# Patient Record
Sex: Female | Born: 1955 | Hispanic: Yes | Marital: Married | Smoking: Never smoker
Health system: Southern US, Community
[De-identification: ages and names within clinical notes are randomized; demographics above are authoritative.]

## PROBLEM LIST (undated history)

## (undated) DIAGNOSIS — I10 Essential (primary) hypertension: Secondary | ICD-10-CM

---

## 2004-03-03 ENCOUNTER — Ambulatory Visit: Payer: Self-pay

## 2005-05-11 ENCOUNTER — Ambulatory Visit: Payer: Self-pay

## 2006-06-28 ENCOUNTER — Ambulatory Visit: Payer: Self-pay

## 2006-10-24 ENCOUNTER — Ambulatory Visit: Payer: Self-pay | Admitting: Family Medicine

## 2007-07-17 ENCOUNTER — Ambulatory Visit: Payer: Self-pay

## 2008-07-08 ENCOUNTER — Ambulatory Visit: Payer: Self-pay

## 2009-04-13 IMAGING — US ABDOMEN ULTRASOUND
1 series · 17 of 25 positions shown · non-contrast
Comparison: none

REASON FOR EXAM: abdominal pain RUQ  PR notified for interpreter
COMMENTS:

[Series 1: abdomen ultrasound · 17 of 67 slices shown]
[im 1/67]
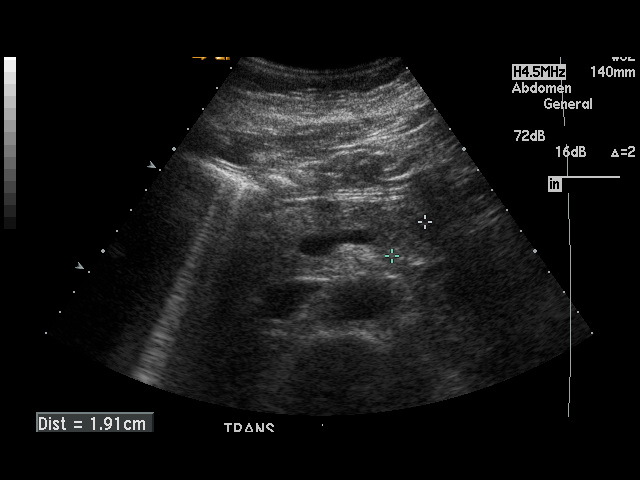
[im 6/67]
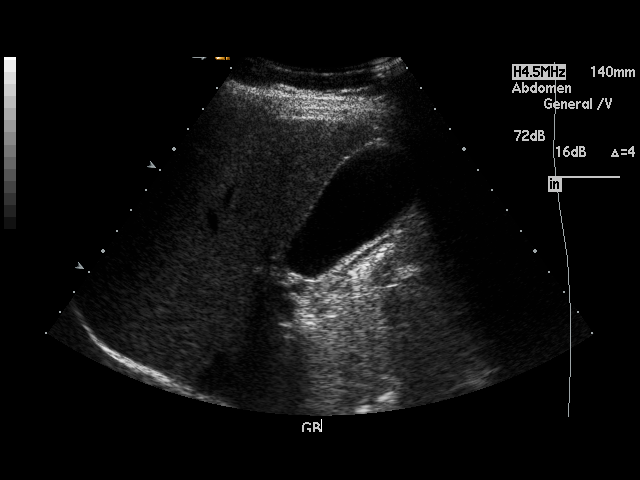
[im 9/67]
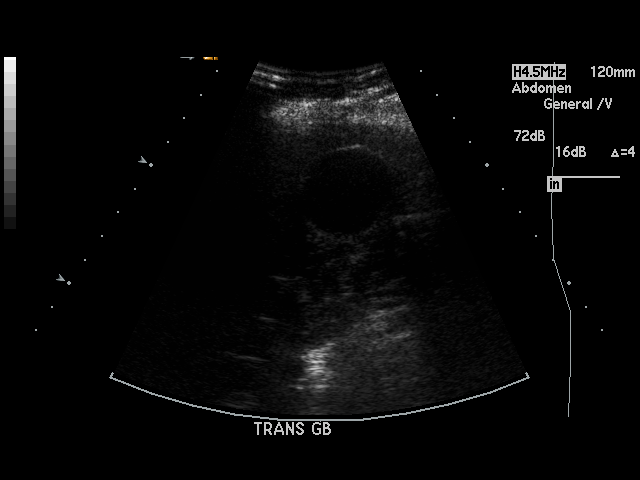
[im 14/67]
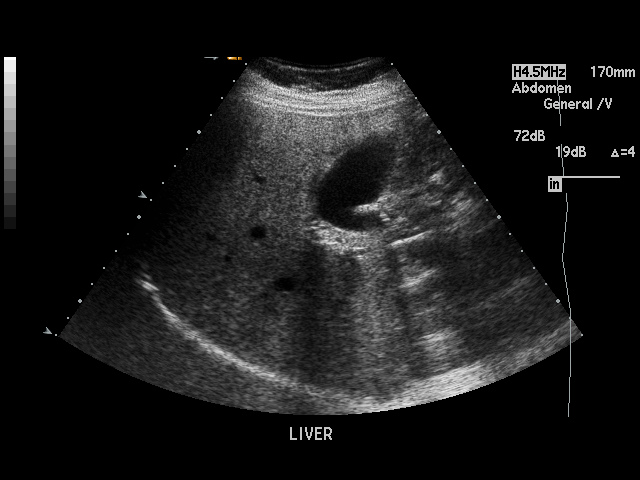
[im 17/67]
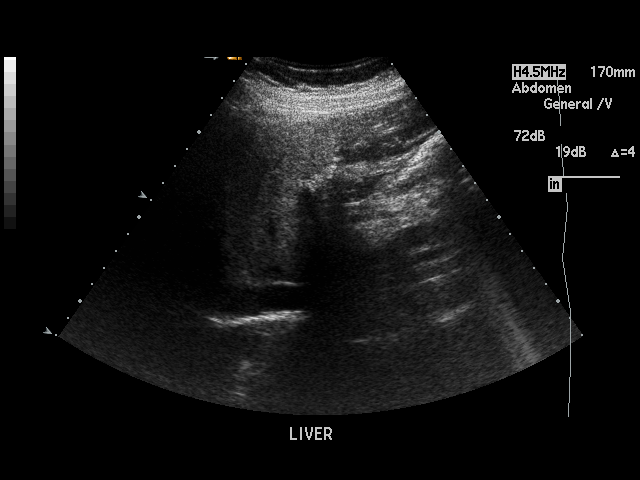
[im 23/67]
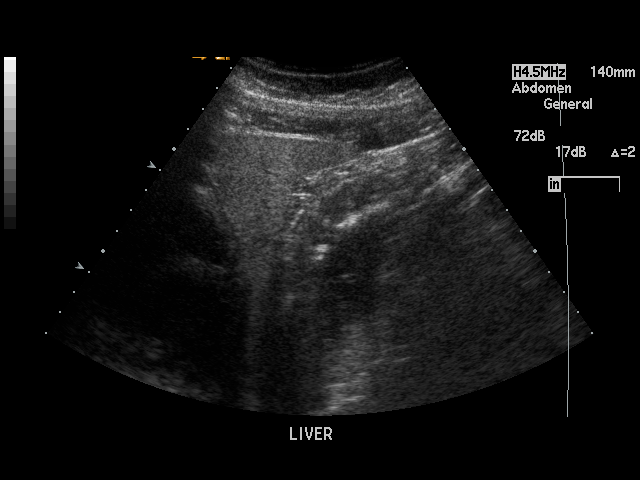
[im 25/67]
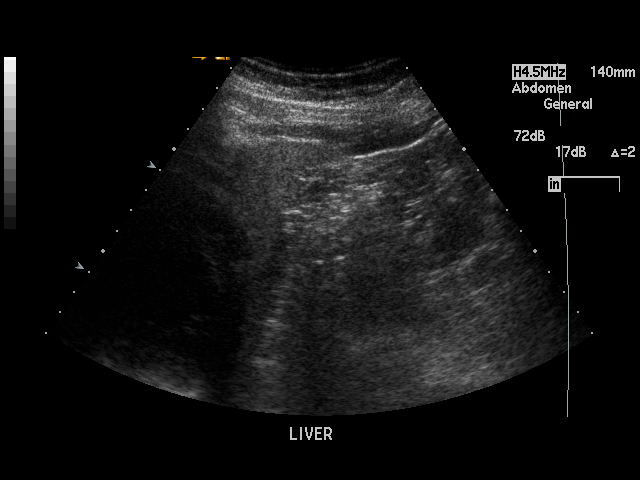
[im 31/67]
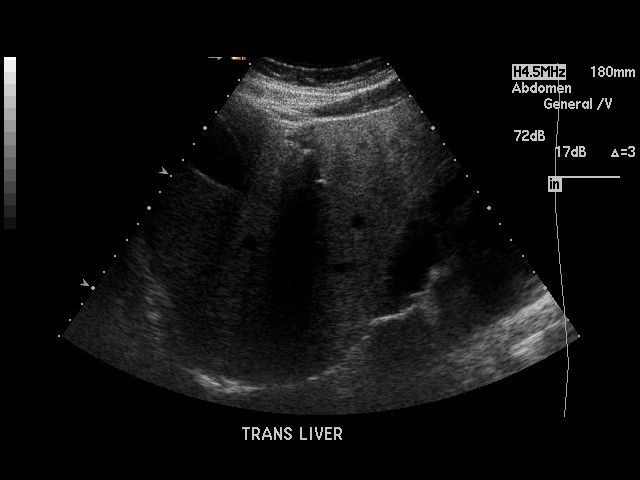
[im 34/67]
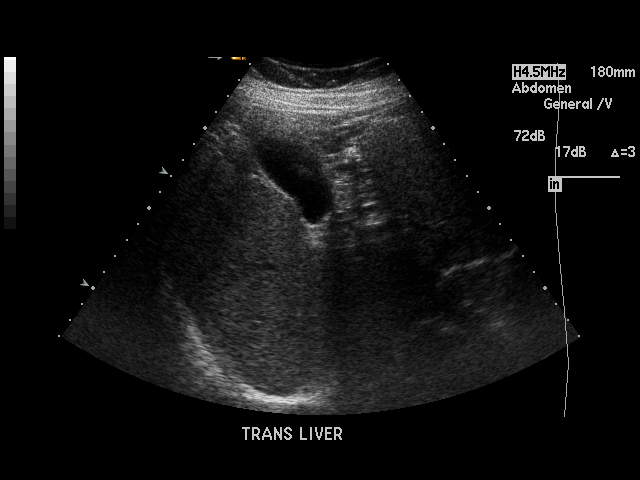
[im 36/67]
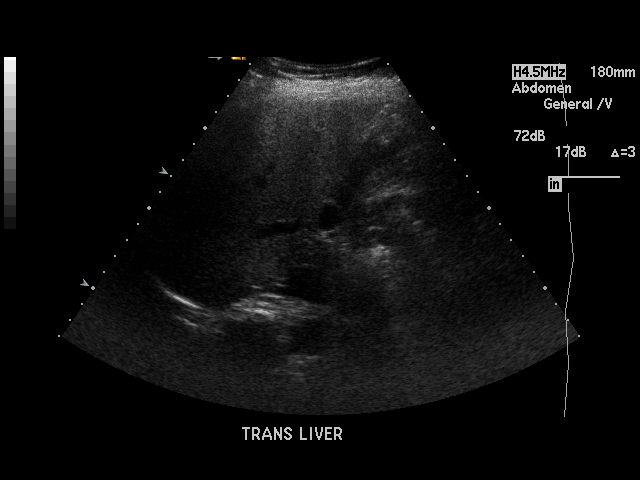
[im 42/67]
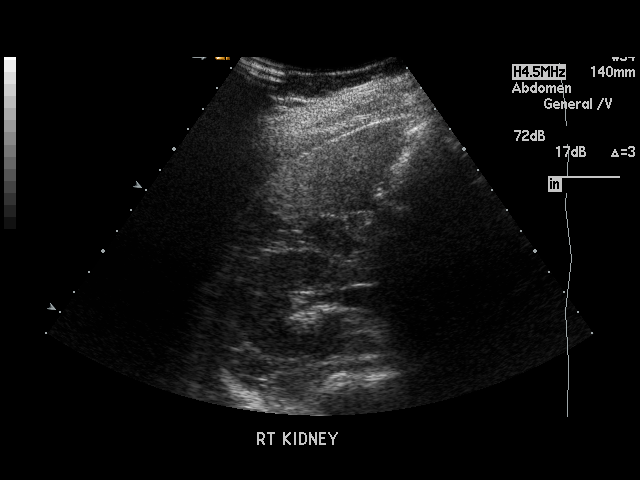
[im 45/67]
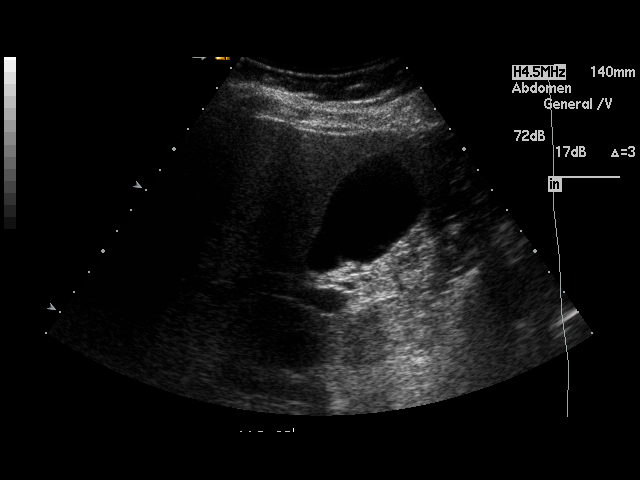
[im 50/67]
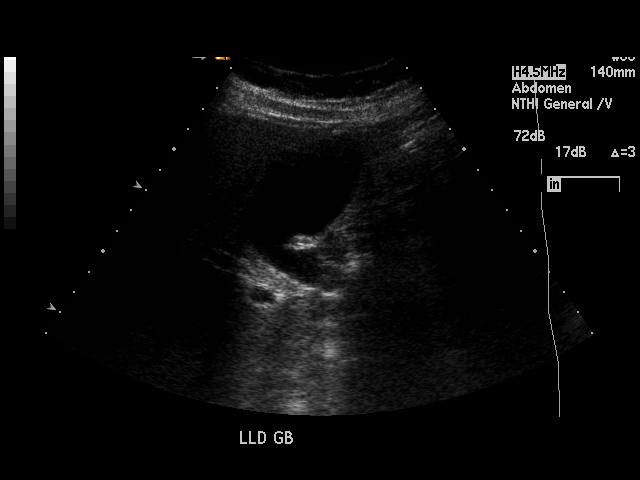
[im 53/67]
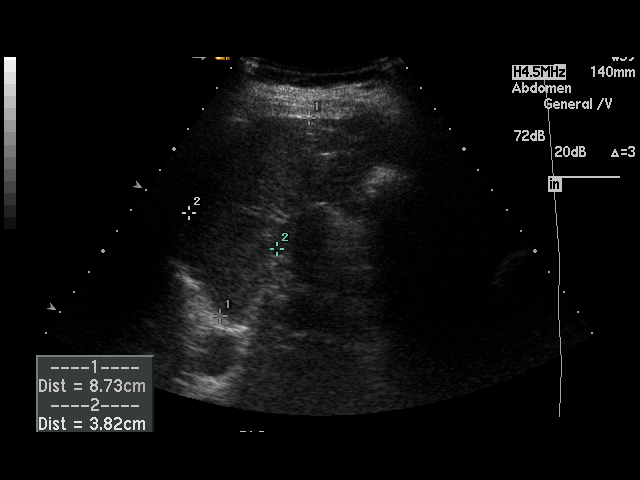
[im 58/67]
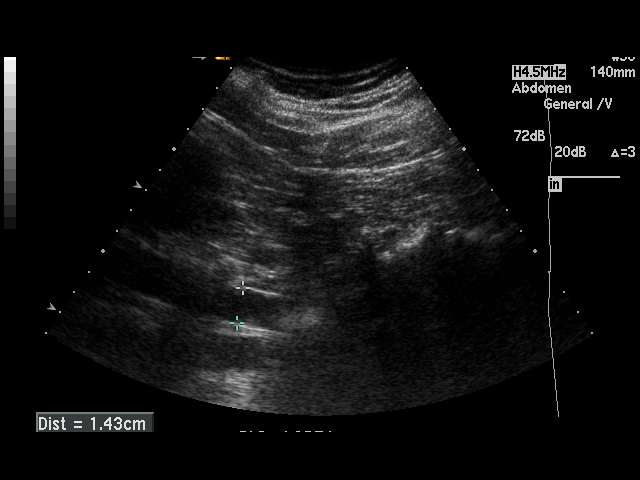
[im 61/67]
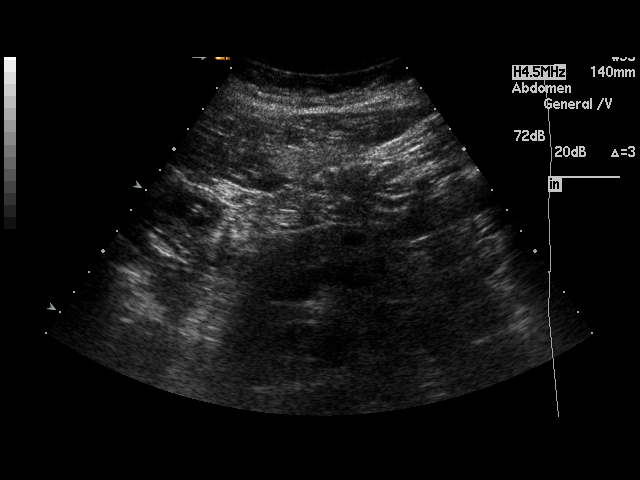
[im 67/67]
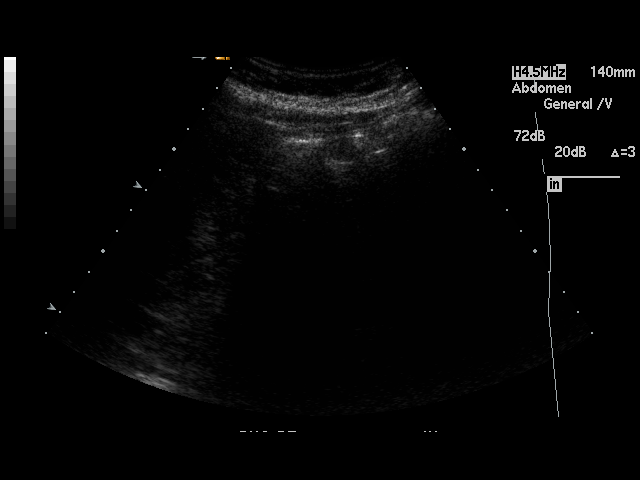

[17 of 25 positions shown; findings below may reference images not displayed]

PROCEDURE:     US  - US ABDOMEN GENERAL SURVEY  - October 24, 2006  [DATE]

RESULT:     The liver, spleen, pancreas and abdominal aorta show no
significant abnormalities. No gallstones are seen. There is no thickening of
the gallbladder wall. The common bile duct measures 5.3 mm in diameter which
is within normal limits. The kidneys show no hydronephrosis. There is no
ascites.
IMPRESSION: 1.     No significant abnormalities are noted.

## 2009-08-20 ENCOUNTER — Ambulatory Visit: Payer: Self-pay | Admitting: Internal Medicine

## 2010-08-27 ENCOUNTER — Ambulatory Visit: Payer: Self-pay | Admitting: Family

## 2011-12-13 ENCOUNTER — Ambulatory Visit: Payer: Self-pay

## 2012-12-24 ENCOUNTER — Ambulatory Visit: Payer: Self-pay

## 2012-12-31 ENCOUNTER — Ambulatory Visit: Payer: Self-pay

## 2014-02-19 ENCOUNTER — Ambulatory Visit: Payer: Self-pay

## 2015-03-04 ENCOUNTER — Ambulatory Visit
Admission: RE | Admit: 2015-03-04 | Discharge: 2015-03-04 | Disposition: A | Discharge status: Home / Self Care | Payer: Self-pay | Source: Ambulatory Visit | Attending: Oncology | Admitting: Oncology

## 2015-03-04 ENCOUNTER — Ambulatory Visit: Payer: Self-pay | Attending: Oncology

## 2015-03-04 VITALS — BP 145/84 | HR 71 | Temp 95.8°F | Resp 18 | Ht 64.96 in | Wt 136.8 lb

## 2015-03-04 DIAGNOSIS — Z0000 Encounter for general adult medical examination without abnormal findings: Secondary | ICD-10-CM

## 2015-03-04 NOTE — Progress Notes (Signed)
Subjective:     Patient ID: Leah Luna, female   DOB: 1955-05-03, 59 y.o.   MRN: [id number]  HPI   Review of Systems     Objective:   Physical Exam  Pulmonary/Chest: Right breast exhibits no inverted nipple, no mass, no nipple discharge, no skin change and no tenderness. Left breast exhibits no inverted nipple, no mass, no nipple discharge, no skin change and no tenderness. Breasts are symmetrical.       Assessment:     59 Year [facility]  Patient screened, and meets [facility] eligibility.  Patient does not have insurance, Medicare or Medicaid.  Handout given on Affordable Care Act. CBE unremarkable. Instructed patient on breast self-exam using teach back method    Plan:     Sent for bilateral screening mammogram.  Leah MansMaritza Luna interpreted exam.

## 2015-03-11 NOTE — Progress Notes (Unsigned)
Letter mailed from [facility] to notify of normal mammogram results.  Patient to return in one year for annual screening.  Copy to [facility]. 

## 2015-06-21 IMAGING — MG MM DIGITAL SCREENING BILAT W/ CAD
1 series · 4 of 4 positions shown · non-contrast
Comparison: Prior studies dating back to 05/11/2005

REASON FOR EXAM: [REDACTED] SCR MAMMO
COMMENTS:

PROCEDURE:     MAM - MAM [REDACTED] DIG SCREEN MAM W/CAD  - December 31, 2012  [DATE]
CLINICAL DATA: Screening.
DIGITAL SCREENING BILATERAL MAMMOGRAM WITH CAD

[R CC · right · 4 of 4 slices shown]
[im 1/4]
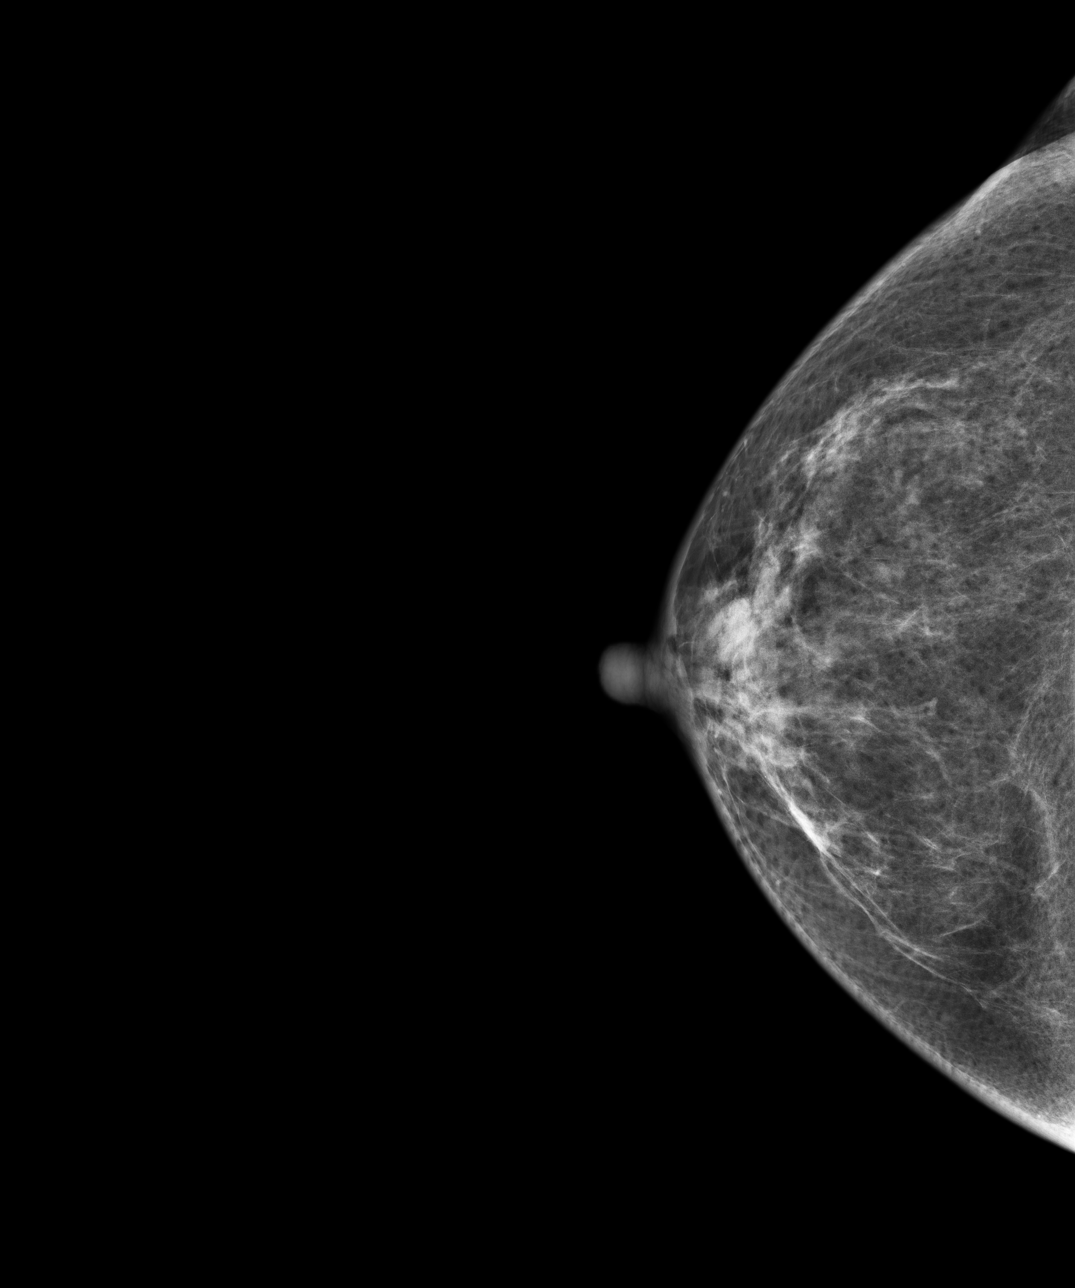
[im 2/4]
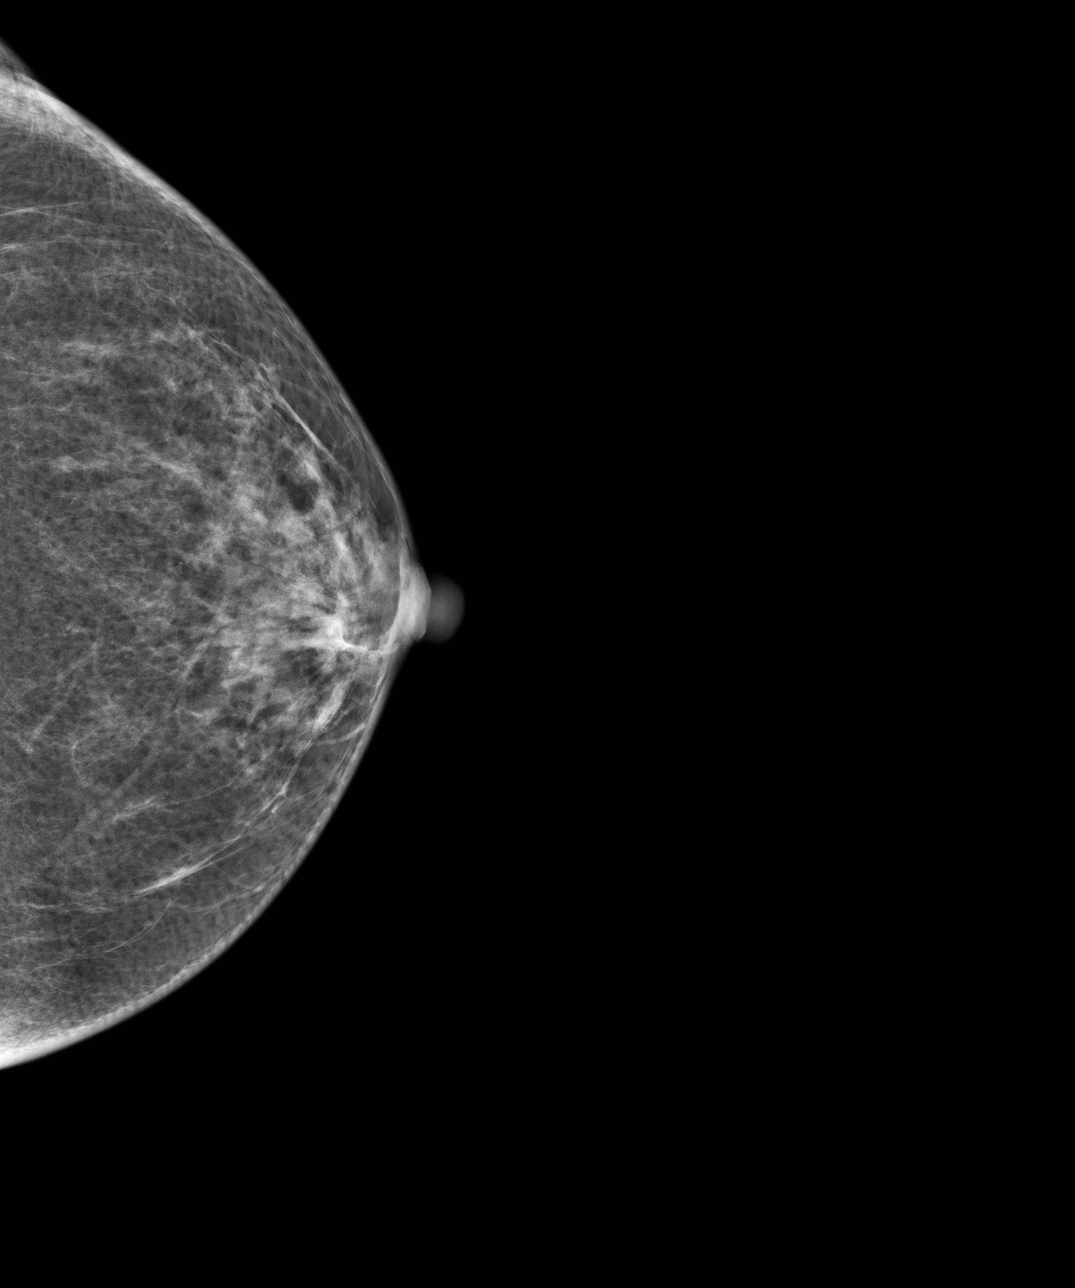
[im 3/4]
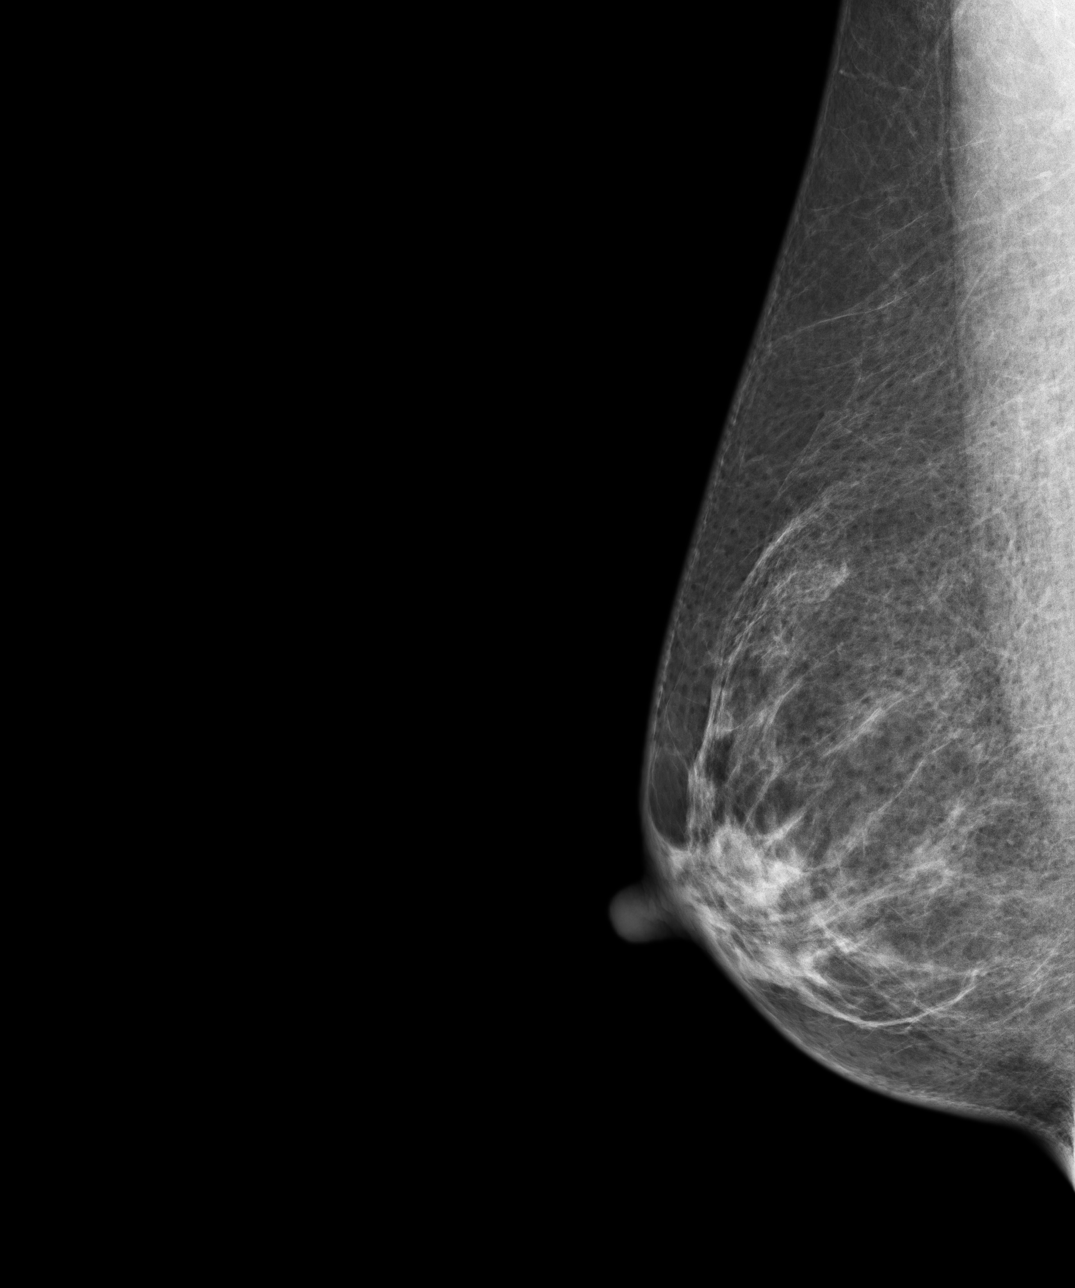
[im 4/4]
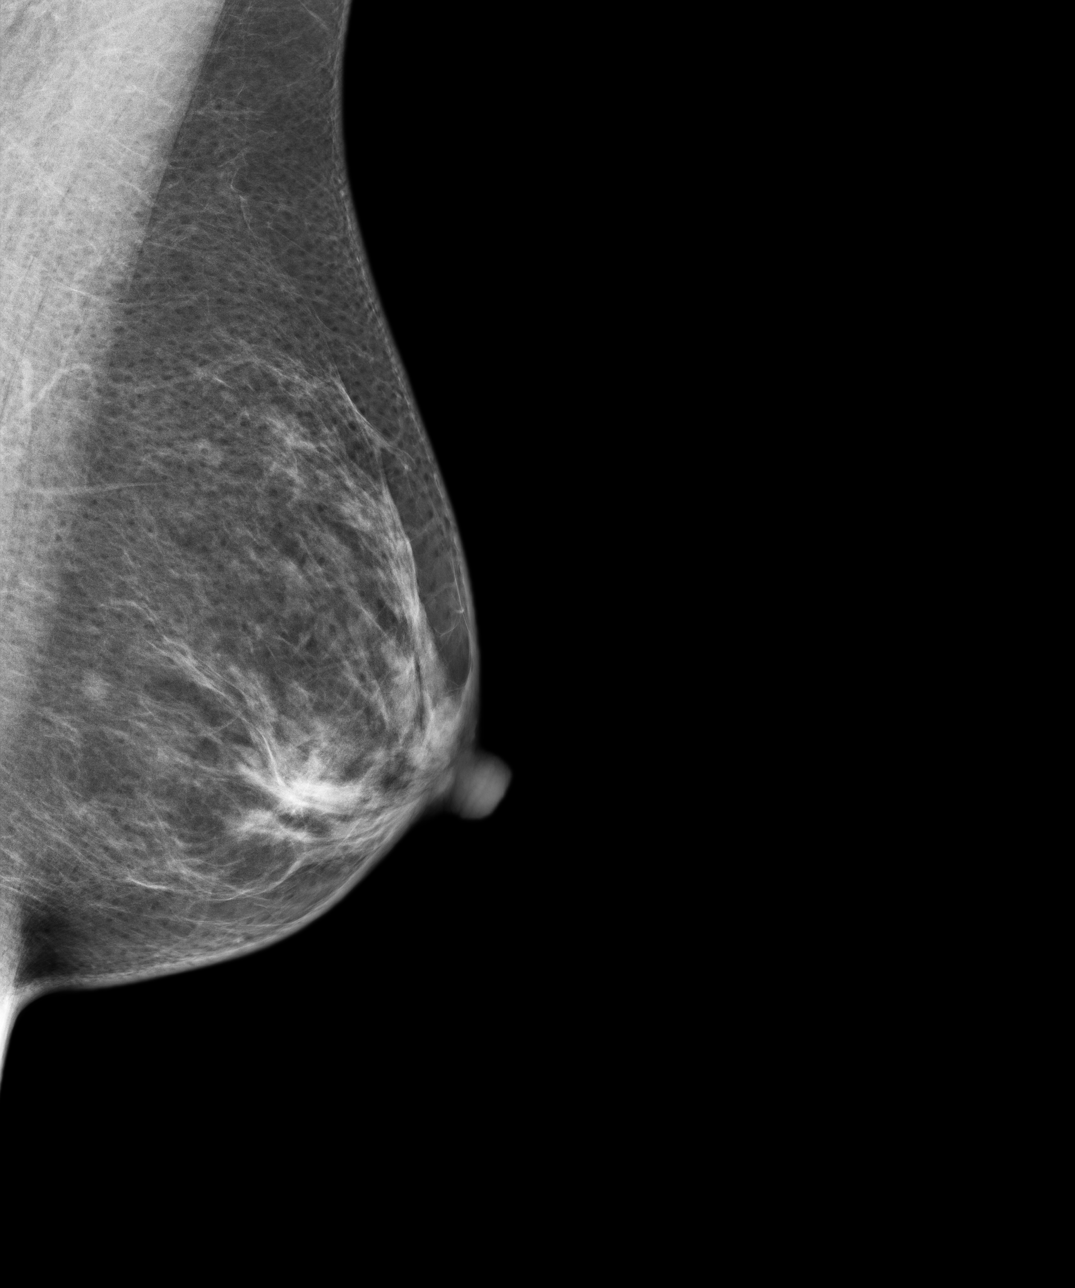

[4 of 4 positions shown; findings below may reference images not displayed]

FINDINGS: ACR Breast Density Category b: There are scattered areas of
fibroglandular density.

There is no suspicious dominant mass, architectural distortion, or
calcification to suggest malignancy.

Images were processed with CAD.
IMPRESSION: No mammographic evidence of malignancy.

A result letter of this screening mammogram will be mailed directly
to the patient.

RECOMMENDATION:
Screening mammogram in one year. (Code:[id number])

BI-RADS CATEGORY 1:  Negative.

## 2016-03-09 ENCOUNTER — Ambulatory Visit: Payer: Self-pay | Attending: Oncology | Admitting: *Deleted

## 2016-03-09 ENCOUNTER — Ambulatory Visit
Admission: RE | Admit: 2016-03-09 | Discharge: 2016-03-09 | Disposition: A | Discharge status: Home / Self Care | Payer: Self-pay | Source: Ambulatory Visit | Attending: Oncology | Admitting: Oncology

## 2016-03-09 ENCOUNTER — Encounter: Payer: Self-pay | Admitting: *Deleted

## 2016-03-09 VITALS — BP 158/88 | HR 71 | Temp 98.1°F | Ht 66.14 in | Wt 139.1 lb

## 2016-03-09 DIAGNOSIS — Z0000 Encounter for general adult medical examination without abnormal findings: Secondary | ICD-10-CM

## 2016-03-09 NOTE — Progress Notes (Signed)
Subjective:     Patient ID: Leah Luna, female   DOB: 1956-04-04, 60 y.o.   MRN: [id number]  HPI   Review of Systems     Objective:   Physical Exam  Pulmonary/Chest: Right breast exhibits no inverted nipple, no mass, no nipple discharge, no skin change and no tenderness. Left breast exhibits no inverted nipple, no mass, no nipple discharge, no skin change and no tenderness. Breasts are symmetrical.  Abdominal: There is no splenomegaly or hepatomegaly.  Genitourinary: No labial fusion. There is no rash, tenderness, lesion or injury on the right labia. There is no rash, tenderness, lesion or injury on the left labia. Cervix exhibits no motion tenderness, no discharge and no friability. Right adnexum displays no mass, no tenderness and no fullness. Left adnexum displays no mass, no tenderness and no fullness. No erythema, tenderness or bleeding in the vagina. No foreign body in the vagina. No signs of injury around the vagina. No vaginal discharge found.       Assessment:     60 [facility] Hispanic [facility]  Maritza, the interpreter present during the interview and exam.  Clinical breast exam unremarkable.  Taught self breast awareness.  Specimen collected for pap smear without difficulty.  Patient has been screened for eligibility.  She does not have any insurance, Medicare or Medicaid.  She also meets financial eligibility.  Hand-out given on the Affordable Care Act.    Plan:     Screening mammogram ordered.  Specimen for pap sent to the lab.  Will follow-up per [facility] protocol.

## 2016-03-09 NOTE — Patient Instructions (Signed)
Prueba del VPH (HPV Test) La prueba del virus del papiloma humano (VPH) se usa para detectar los tipos de infeccin por el VPH de alto riesgo. El VPH es un grupo de alrededor de 100 virus. Muchos de estos virus causan tumores dentro de los genitales, sobre ellos o a su alrededor. La mayora de los VPH provocan infecciones que suelen desaparecen sin tratamiento. Sin embargo, los tipos 6, 11, 16 y 18 del VPH se consideran de alto riesgo y pueden aumentar el riesgo de padecer cncer de cuello del tero o de ano si la infeccin no se trata. La prueba del VPH identifica las cadenas de ADN (genticas) de la infeccin por el VPH, por lo que tambin se denomina prueba de ADN para el VPH. Aunque el VPH se encuentra tanto en los hombres como en las mujeres, la prueba del VPH se usa solo para detectar un mayor riesgo de cncer en las mujeres:  Con una prueba de Papanicolaou anormal.  Despus del tratamiento de una prueba de Papanicolaou anormal.  Entre 30y 65aos.  Despus del tratamiento de una infeccin por el VPH de alto riesgo. La prueba del VPH se puede hacer al mismo tiempo que un examen plvico y una prueba de Papanicolaou en mujeres de ms de 30 aos. Tanto la prueba del VPH como la prueba de Papanicolaou requieren una muestra de clulas del cuello del tero. PREPARACIN PARA EL ESTUDIO  No se haga lavados vaginales ni se d un bao durante 24 a 48 horas antes de la prueba o como se lo haya indicado el mdico.  No tenga sexo durante 24 a 48 horas antes de la prueba o como se lo haya indicado el mdico.  Es posible que se le pida que reprograme la prueba si est menstruando.  Se le pedir que orine antes de la prueba. RESULTADOS Es su responsabilidad retirar el resultado del estudio. Consulte en el laboratorio o en el departamento en el que fue realizado el estudio cundo y cmo podr obtener los resultados. Hable con el mdico si tiene alguna pregunta sobre los resultados. El resultado ser  negativo o positivo. Significado de los resultados negativos de la prueba Un resultado negativo de la prueba del VPH significa que no se detect el VPH, y es muy probable que no tenga el virus. Significado de los resultados positivos de la prueba Un resultado positivo de la prueba del VPH indica que tiene el virus.  Si el resultado de la prueba muestra la presencia de alguna cadena del VPH de alto riesgo, puede tener mayor riesgo de padecer cncer de cuello del tero o de ano si la infeccin no se trata.  Si se encuentran cadenas del VPH de bajo riesgo, es poco probable que tenga un alto riesgo de padecer cncer. Hable con el mdico sobre los resultados. El mdico utilizar los resultados para realizar un diagnstico y determinar un plan de tratamiento adecuado para usted. Esta informacin no tiene como fin reemplazar el consejo del mdico. Asegrese de hacerle al mdico cualquier pregunta que tenga. Document Released: 07/07/2008 Document Revised: 04/11/2014 Document Reviewed: 08/06/2013 Elsevier Interactive Patient Education  2017 Elsevier Inc.  Gave patient hand-out, Women Staying Healthy, Active and Well from [facility], with education on breast health, pap smears, heart and colon health.  

## 2016-03-11 LAB — PAP LB AND HPV HIGH-RISK
HPV, HIGH-RISK: NEGATIVE
PAP SMEAR COMMENT: 0

## 2016-03-16 ENCOUNTER — Encounter: Payer: Self-pay | Admitting: *Deleted

## 2016-03-16 NOTE — Progress Notes (Signed)
Letter mailed to inform patient of her normal mammogram and pap smear.  She is to return in one year for annual screening.  [facility] to [place].

## 2016-11-14 ENCOUNTER — Ambulatory Visit: Payer: Self-pay

## 2017-03-20 ENCOUNTER — Ambulatory Visit
Admission: RE | Admit: 2017-03-20 | Discharge: 2017-03-20 | Disposition: A | Discharge status: Home / Self Care | Payer: Self-pay | Source: Ambulatory Visit | Attending: Oncology | Admitting: Oncology

## 2017-03-20 ENCOUNTER — Encounter (INDEPENDENT_AMBULATORY_CARE_PROVIDER_SITE_OTHER): Payer: Self-pay

## 2017-03-20 ENCOUNTER — Encounter: Payer: Self-pay | Admitting: *Deleted

## 2017-03-20 ENCOUNTER — Ambulatory Visit: Payer: Self-pay | Attending: Oncology | Admitting: *Deleted

## 2017-03-20 VITALS — BP 133/85 | HR 67 | Temp 97.2°F | Ht 64.0 in | Wt 139.0 lb

## 2017-03-20 DIAGNOSIS — Z0000 Encounter for general adult medical examination without abnormal findings: Secondary | ICD-10-CM

## 2017-03-20 NOTE — Progress Notes (Signed)
Subjective:     Patient ID: Leah Luna, female   DOB: 05-24-1955, 61 y.o.   MRN: [id number]  HPI   Review of Systems     Objective:   Physical Exam  Pulmonary/Chest: Right breast exhibits no inverted nipple, no mass, no nipple discharge, no skin change and no tenderness. Left breast exhibits no inverted nipple, no mass, no nipple discharge, no skin change and no tenderness. Breasts are symmetrical.       Assessment:     61 [facility] Hispanic [facility]  Leah Luna, the interpreter present during the interview and exam.  Clinical breast exam unremarkable.  Taught self breast awareness.  Last pap on 03/09/16 was negative / negative.  Informed patient next pap will be due in 2022.  Patient has been screened for eligibility.  She does not have any insurance, Medicare or Medicaid.  She also meets financial eligibility.  Hand-out given on the Affordable Care Act.    Plan:     Screening mammogram ordered.  Will follow-up per [facility] protocol.

## 2017-03-20 NOTE — Patient Instructions (Signed)
Gave patient hand-out, Women Staying Healthy, Active and Well from [facility], with education on breast health, pap smears, heart and colon health. 

## 2017-03-21 ENCOUNTER — Encounter: Payer: Self-pay | Admitting: *Deleted

## 2017-03-21 NOTE — Progress Notes (Signed)
Letter mailed from [facility] to inform patient of her normal mammogram results.  Patient is to follow-up with annual screening in one year.  [facility] to [place]. 

## 2018-04-30 ENCOUNTER — Ambulatory Visit: Payer: Self-pay | Attending: Oncology | Admitting: *Deleted

## 2018-04-30 ENCOUNTER — Encounter (INDEPENDENT_AMBULATORY_CARE_PROVIDER_SITE_OTHER): Payer: Self-pay

## 2018-04-30 ENCOUNTER — Ambulatory Visit
Admission: RE | Admit: 2018-04-30 | Discharge: 2018-04-30 | Disposition: A | Discharge status: Home / Self Care | Payer: Self-pay | Source: Ambulatory Visit | Attending: Oncology | Admitting: Oncology

## 2018-04-30 ENCOUNTER — Encounter: Payer: Self-pay | Admitting: *Deleted

## 2018-04-30 VITALS — BP 154/83 | HR 73 | Temp 98.2°F | Ht 65.25 in | Wt 143.6 lb

## 2018-04-30 DIAGNOSIS — Z0000 Encounter for general adult medical examination without abnormal findings: Secondary | ICD-10-CM | POA: Insufficient documentation

## 2018-04-30 NOTE — Progress Notes (Signed)
  Subjective:     Patient ID: Leah Luna, female   DOB: [birth year], 63 y.o.   MRN: [id number]  HPI   Review of Systems     Objective:   Physical Exam Chest:     Breasts:        Right: No swelling, bleeding, inverted nipple, mass, nipple discharge, skin change or tenderness.        Left: No swelling, bleeding, inverted nipple, mass, nipple discharge, skin change or tenderness.     Comments: Left nipple points lower than the right       Assessment:     63 year old Hispanic female [facility]  Leah Luna, the interpreter present during the interview and exam.  Clinical breast exam unremarkable.  Taught self breast awareness.  Last pap on 03/09/16 was negative / negative.  Next pap due in 2022.  Patient has been screened for eligibility.  She does not have any insurance, Medicare or Medicaid.  She also meets financial eligibility.  Hand-out given on the Affordable Care Act. Risk Assessment    Risk Scores      04/30/2018   Last edited by: Alta Corning, CMA   5-year risk: 0.7 %   Lifetime risk: 3.4 %            Plan:     Screening mammogram ordered.  Will follow-up per [facility] protocol.

## 2018-04-30 NOTE — Patient Instructions (Signed)
Gave patient hand-out, Women Staying Healthy, Active and Well from [facility], with education on breast health, pap smears, heart and colon health. 

## 2018-05-01 ENCOUNTER — Encounter: Payer: Self-pay | Admitting: *Deleted

## 2018-05-01 NOTE — Progress Notes (Signed)
Letter mailed from [facility] to inform patient of her normal mammogram results.  Patient is to follow-up with annual screening in one year.  [facility] to [place]. 

## 2019-04-09 ENCOUNTER — Other Ambulatory Visit: Payer: Self-pay

## 2019-04-09 NOTE — Progress Notes (Signed)
Phoned patient to pre admit  and check [facility] eligibility due to COVID.  Identity established using two patient identifiers.  Patient to arrive at [facility] for mammogram at 10:30 on 04/10/2019.

## 2019-04-10 ENCOUNTER — Encounter: Payer: Self-pay | Admitting: *Deleted

## 2019-04-10 ENCOUNTER — Ambulatory Visit: Payer: Self-pay | Attending: Oncology | Admitting: *Deleted

## 2019-04-10 ENCOUNTER — Other Ambulatory Visit: Payer: Self-pay

## 2019-04-10 ENCOUNTER — Ambulatory Visit
Admission: RE | Admit: 2019-04-10 | Discharge: 2019-04-10 | Disposition: A | Discharge status: Home / Self Care | Payer: Self-pay | Source: Ambulatory Visit | Attending: Oncology | Admitting: Oncology

## 2019-04-10 DIAGNOSIS — Z0000 Encounter for general adult medical examination without abnormal findings: Secondary | ICD-10-CM

## 2019-04-10 NOTE — Progress Notes (Signed)
Letter mailed from [facility] to inform patient of her normal mammogram results.  Patient is to follow-up with annual screening in one year. 

## 2019-04-10 NOTE — Progress Notes (Signed)
  Subjective:     Patient ID: Leah Luna, female   DOB: December 10, 1955, 64 y.o.   MRN: [id number]  HPI   Review of Systems     Objective:   Physical Exam     Assessment:     Due to Covid 19 pandemic a televisit was used to enroll patient into our [facility] program and obtain her health history.  Verbal consent given.  Patient denies any breast problems.  Last pap smear in 2017 was negative / negative.  Next pap is due in 2022.  Patient presented directly to [facility] for her mammogram today.  See Dondra Spry breast cancer risk assessment below:   Risk Assessment    Risk Scores      04/10/2019 04/30/2018   Last edited by: Scarlett Presto, RN [place], Freada Bergeron, CMA   5-year risk: 0.8 % 0.7 %   Lifetime risk: 3.3 % 3.4 %            Plan:     Screening mammogram ordered.  Will follow up per [facility] protocol.

## 2020-04-13 ENCOUNTER — Other Ambulatory Visit: Payer: Self-pay | Admitting: Adult Health

## 2020-04-13 DIAGNOSIS — Z1231 Encounter for screening mammogram for malignant neoplasm of breast: Secondary | ICD-10-CM

## 2020-05-05 ENCOUNTER — Ambulatory Visit
Admission: RE | Admit: 2020-05-05 | Discharge: 2020-05-05 | Disposition: A | Discharge status: Home / Self Care | Payer: Medicare (Managed Care) | Source: Ambulatory Visit | Attending: Adult Health | Admitting: Adult Health

## 2020-05-05 ENCOUNTER — Other Ambulatory Visit: Payer: Self-pay

## 2020-05-05 DIAGNOSIS — Z1231 Encounter for screening mammogram for malignant neoplasm of breast: Secondary | ICD-10-CM | POA: Diagnosis present

## 2020-08-07 ENCOUNTER — Encounter: Payer: Self-pay | Admitting: Emergency Medicine

## 2020-08-07 ENCOUNTER — Other Ambulatory Visit: Payer: Self-pay

## 2020-08-07 ENCOUNTER — Emergency Department
Admission: EM | Admit: 2020-08-07 | Discharge: 2020-08-07 | Disposition: A | Discharge status: Home / Self Care | Payer: Medicare (Managed Care) | Attending: Emergency Medicine | Admitting: Emergency Medicine

## 2020-08-07 ENCOUNTER — Emergency Department: Payer: Medicare (Managed Care)

## 2020-08-07 DIAGNOSIS — S0591XA Unspecified injury of right eye and orbit, initial encounter: Secondary | ICD-10-CM | POA: Diagnosis present

## 2020-08-07 DIAGNOSIS — R519 Headache, unspecified: Secondary | ICD-10-CM | POA: Diagnosis not present

## 2020-08-07 DIAGNOSIS — W19XXXA Unspecified fall, initial encounter: Secondary | ICD-10-CM

## 2020-08-07 DIAGNOSIS — S0011XA Contusion of right eyelid and periocular area, initial encounter: Secondary | ICD-10-CM | POA: Diagnosis not present

## 2020-08-07 DIAGNOSIS — W182XXA Fall in (into) shower or empty bathtub, initial encounter: Secondary | ICD-10-CM | POA: Diagnosis not present

## 2020-08-07 DIAGNOSIS — J329 Chronic sinusitis, unspecified: Secondary | ICD-10-CM | POA: Insufficient documentation

## 2020-08-07 NOTE — Discharge Instructions (Signed)
Take Tylenol as needed for pain.

## 2020-08-07 NOTE — ED Provider Notes (Signed)
[facility]-EMERGENCY DEPARTMENT  ____________________________________________  Time seen: Approximately 9:27 PM  I have reviewed the triage vital signs and the nursing notes.   HISTORY  Chief Complaint Fall   Historian Patient     HPI Leah Luna is a 65 y.o. female presents to the emergency department with nasal pain and right-sided facial pain after patient fell while in the shower.  Patient did not lose consciousness.  She denies current neck pain.  No numbness or tingling in the upper and lower extremities.  No chest pain, chest tightness or abdominal pain.   History reviewed. No pertinent past medical history.   Immunizations up to date:  Yes.     History reviewed. No pertinent past medical history.  There are no problems to display for this patient.   History reviewed. No pertinent surgical history.  Prior to Admission medications   Not on File    Allergies Eggs or egg-derived products  Family History  Problem Relation Age of Onset  . Breast cancer Neg Hx     Social History Social History   Tobacco Use  . Smoking status: Never Smoker  . Smokeless tobacco: Never Used  Substance Use Topics  . Alcohol use: No    Alcohol/week: 0.0 standard drinks  . Drug use: No     Review of Systems  Constitutional: No fever/chills Eyes:  No discharge ENT: No upper respiratory complaints. Respiratory: no cough. No SOB/ use of accessory muscles to breath Gastrointestinal:   No nausea, no vomiting.  No diarrhea.  No constipation. Musculoskeletal: Negative for musculoskeletal pain. Skin: Negative for rash, abrasions, lacerations, ecchymosis.   ____________________________________________   PHYSICAL EXAM:  VITAL SIGNS: ED Triage Vitals [08/07/20 1915]  Enc Vitals Group     BP (!) 143/77     Pulse Rate 75     Resp 18     Temp 98.3 F (36.8 C)     Temp Source Oral     SpO2 99 %     Weight 139 lb (63 kg)     Height 5\' 5"  (1.651 m)     Head  Circumference      Peak Flow      Pain Score 8     Pain Loc      Pain Edu?      Excl. in GC?      Constitutional: Alert and oriented. Well appearing and in no acute distress. Eyes: Conjunctivae are normal. PERRL. EOMI. Head: Atraumatic. Patient has periorbital ecchymosis on the right. ENT:      Nose: No congestion/rhinnorhea.      Mouth/Throat: Mucous membranes are moist.  Neck: No stridor.  No cervical spine tenderness to palpation. Cardiovascular: Normal rate, regular rhythm. Normal S1 and S2.  Good peripheral circulation. Respiratory: Normal respiratory effort without tachypnea or retractions. Lungs CTAB. Good air entry to the bases with no decreased or absent breath sounds Gastrointestinal: Bowel sounds x 4 quadrants. Soft and nontender to palpation. No guarding or rigidity. No distention. Musculoskeletal: Full range of motion to all extremities. No obvious deformities noted Neurologic:  Normal for age. No gross focal neurologic deficits are appreciated.  Skin:  Skin is warm, dry and intact. No rash noted. Psychiatric: Mood and affect are normal for age. Speech and behavior are normal.   ____________________________________________   LABS (all labs ordered are listed, but only abnormal results are displayed)  Labs Reviewed - No data to display ____________________________________________  EKG   ____________________________________________  RADIOLOGY  Joseph ArtWoods, personally viewed and evaluated these images (plain radiographs) as part of my medical decision making, as well as reviewing the written report by the radiologist.  CT Head Wo Contrast  Result Date: 08/07/2020 CLINICAL DATA:  Recent fall in shower with headaches and facial pain, initial encounter EXAM: CT HEAD WITHOUT CONTRAST CT MAXILLOFACIAL WITHOUT CONTRAST CT CERVICAL SPINE WITHOUT CONTRAST TECHNIQUE: Multidetector CT imaging of the head, cervical spine, and maxillofacial structures were performed using  the standard protocol without intravenous contrast. Multiplanar CT image reconstructions of the cervical spine and maxillofacial structures were also generated. COMPARISON:  None. FINDINGS: CT HEAD FINDINGS Brain: No evidence of acute infarction, hemorrhage, hydrocephalus, extra-axial collection or mass lesion/mass effect. Vascular: No hyperdense vessel or unexpected calcification. Skull: Normal. Negative for fracture or focal lesion. Other: Paranasal sinuses demonstrate mild mucosal thickening in the sphenoid sinus. CT MAXILLOFACIAL FINDINGS Osseous: No fracture or mandibular dislocation. No destructive process. Scattered dental caries are noted. Orbits: Orbits and their contents are within normal limits. Sinuses: Paranasal sinuses show mucosal thickening in the sphenoid sinus. Soft tissues: Surrounding soft tissue structures are within normal limits. No soft tissue edema or hematoma is noted. CT CERVICAL SPINE FINDINGS Alignment: Straightening of the normal cervical lordosis is noted consistent with muscle spasm Skull base and vertebrae: 7 cervical segments are well visualized. Vertebral body height is well maintained. No acute fracture or acute facet abnormality is noted. Mild osteophytic changes and facet hypertrophic changes are seen. The odontoid is within normal limits. Soft tissues and spinal canal: Surrounding soft tissue structures are unremarkable. Upper chest: Visualized lung apices are within normal limits. Other: None IMPRESSION: CT of the head: No acute intracranial abnormality noted. CT of the maxillofacial bones: No acute bony abnormality is noted. Mild mucosal thickening in the sphenoid sinus. CT of the cervical spine: Changes suggestive of muscular spasm. Mild degenerative changes noted without acute bony abnormality. Electronically Signed   By: Alcide CleverMark  Lukens M.D.   On: 08/07/2020 19:57   CT Cervical Spine Wo Contrast  Result Date: 08/07/2020 CLINICAL DATA:  Recent fall in shower with headaches  and facial pain, initial encounter EXAM: CT HEAD WITHOUT CONTRAST CT MAXILLOFACIAL WITHOUT CONTRAST CT CERVICAL SPINE WITHOUT CONTRAST TECHNIQUE: Multidetector CT imaging of the head, cervical spine, and maxillofacial structures were performed using the standard protocol without intravenous contrast. Multiplanar CT image reconstructions of the cervical spine and maxillofacial structures were also generated. COMPARISON:  None. FINDINGS: CT HEAD FINDINGS Brain: No evidence of acute infarction, hemorrhage, hydrocephalus, extra-axial collection or mass lesion/mass effect. Vascular: No hyperdense vessel or unexpected calcification. Skull: Normal. Negative for fracture or focal lesion. Other: Paranasal sinuses demonstrate mild mucosal thickening in the sphenoid sinus. CT MAXILLOFACIAL FINDINGS Osseous: No fracture or mandibular dislocation. No destructive process. Scattered dental caries are noted. Orbits: Orbits and their contents are within normal limits. Sinuses: Paranasal sinuses show mucosal thickening in the sphenoid sinus. Soft tissues: Surrounding soft tissue structures are within normal limits. No soft tissue edema or hematoma is noted. CT CERVICAL SPINE FINDINGS Alignment: Straightening of the normal cervical lordosis is noted consistent with muscle spasm Skull base and vertebrae: 7 cervical segments are well visualized. Vertebral body height is well maintained. No acute fracture or acute facet abnormality is noted. Mild osteophytic changes and facet hypertrophic changes are seen. The odontoid is within normal limits. Soft tissues and spinal canal: Surrounding soft tissue structures are unremarkable. Upper chest: Visualized lung apices are within normal limits. Other: None IMPRESSION: CT of the head: No  acute intracranial abnormality noted. CT of the maxillofacial bones: No acute bony abnormality is noted. Mild mucosal thickening in the sphenoid sinus. CT of the cervical spine: Changes suggestive of muscular  spasm. Mild degenerative changes noted without acute bony abnormality. Electronically Signed   By: Alcide Clever M.D.   On: 08/07/2020 19:57   CT Maxillofacial Wo Contrast  Result Date: 08/07/2020 CLINICAL DATA:  Recent fall in shower with headaches and facial pain, initial encounter EXAM: CT HEAD WITHOUT CONTRAST CT MAXILLOFACIAL WITHOUT CONTRAST CT CERVICAL SPINE WITHOUT CONTRAST TECHNIQUE: Multidetector CT imaging of the head, cervical spine, and maxillofacial structures were performed using the standard protocol without intravenous contrast. Multiplanar CT image reconstructions of the cervical spine and maxillofacial structures were also generated. COMPARISON:  None. FINDINGS: CT HEAD FINDINGS Brain: No evidence of acute infarction, hemorrhage, hydrocephalus, extra-axial collection or mass lesion/mass effect. Vascular: No hyperdense vessel or unexpected calcification. Skull: Normal. Negative for fracture or focal lesion. Other: Paranasal sinuses demonstrate mild mucosal thickening in the sphenoid sinus. CT MAXILLOFACIAL FINDINGS Osseous: No fracture or mandibular dislocation. No destructive process. Scattered dental caries are noted. Orbits: Orbits and their contents are within normal limits. Sinuses: Paranasal sinuses show mucosal thickening in the sphenoid sinus. Soft tissues: Surrounding soft tissue structures are within normal limits. No soft tissue edema or hematoma is noted. CT CERVICAL SPINE FINDINGS Alignment: Straightening of the normal cervical lordosis is noted consistent with muscle spasm Skull base and vertebrae: 7 cervical segments are well visualized. Vertebral body height is well maintained. No acute fracture or acute facet abnormality is noted. Mild osteophytic changes and facet hypertrophic changes are seen. The odontoid is within normal limits. Soft tissues and spinal canal: Surrounding soft tissue structures are unremarkable. Upper chest: Visualized lung apices are within normal limits.  Other: None IMPRESSION: CT of the head: No acute intracranial abnormality noted. CT of the maxillofacial bones: No acute bony abnormality is noted. Mild mucosal thickening in the sphenoid sinus. CT of the cervical spine: Changes suggestive of muscular spasm. Mild degenerative changes noted without acute bony abnormality. Electronically Signed   By: Alcide Clever M.D.   On: 08/07/2020 19:57    ____________________________________________    PROCEDURES  Procedure(s) performed:     Procedures     Medications - No data to display   ____________________________________________   INITIAL IMPRESSION / ASSESSMENT AND PLAN / ED COURSE  Pertinent labs & imaging results that were available during my care of the patient were reviewed by me and considered in my medical decision making (see chart for details).      Assessment and plan Fall 65 year old female presents to the emergency department after a mechanical fall while in the shower.  Vital signs were reassuring in triage.  On physical exam, patient had periorbital ecchymosis on the right.  No neurodeficits were noted on exam  CT head, max face and cervical spine showed no signs of intracranial bleed, skull fracture or C-spine fracture. Tylenol was recommended for pain. All patient questions were answered.      ____________________________________________  FINAL CLINICAL IMPRESSION(S) / ED DIAGNOSES  Final diagnoses:  Fall, initial encounter      NEW MEDICATIONS STARTED DURING THIS VISIT:  ED Discharge Orders    None          This chart was dictated using voice recognition software/Dragon. Despite best efforts to proofread, errors can occur which can change the meaning. Any change was purely unintentional.     Orvil Feil, PA-C 08/07/20  [id number]    Minna Antis, MD 08/10/20 [phone]

## 2020-08-07 NOTE — ED Triage Notes (Signed)
Pt comes into the ED via POV c/o fall in the shower earlier today.  Pt was seen at [facility] where they sent here here for further imaging.  Pt denies any LOC or blood thinner use. Pt also c/o nose and right side face pain. Pt ambulatory to triage at this time and in NAD with even and unlabored respirations.

## 2021-02-10 ENCOUNTER — Other Ambulatory Visit: Payer: Self-pay | Admitting: Adult Health

## 2021-02-10 ENCOUNTER — Other Ambulatory Visit: Payer: Self-pay | Admitting: Physician Assistant

## 2021-02-10 DIAGNOSIS — R591 Generalized enlarged lymph nodes: Secondary | ICD-10-CM

## 2021-02-19 ENCOUNTER — Ambulatory Visit
Admission: RE | Admit: 2021-02-19 | Discharge: 2021-02-19 | Disposition: A | Discharge status: Home / Self Care | Payer: Medicare (Managed Care) | Source: Ambulatory Visit | Attending: Adult Health | Admitting: Adult Health

## 2021-02-19 ENCOUNTER — Other Ambulatory Visit: Payer: Self-pay | Admitting: Adult Health

## 2021-02-19 DIAGNOSIS — R1310 Dysphagia, unspecified: Secondary | ICD-10-CM | POA: Diagnosis present

## 2021-02-19 DIAGNOSIS — R591 Generalized enlarged lymph nodes: Secondary | ICD-10-CM | POA: Insufficient documentation

## 2021-06-07 ENCOUNTER — Other Ambulatory Visit: Payer: Self-pay | Admitting: Adult Health

## 2021-06-07 DIAGNOSIS — Z1231 Encounter for screening mammogram for malignant neoplasm of breast: Secondary | ICD-10-CM

## 2021-07-13 ENCOUNTER — Ambulatory Visit
Admission: RE | Admit: 2021-07-13 | Discharge: 2021-07-13 | Disposition: A | Discharge status: Home / Self Care | Payer: Medicare (Managed Care) | Source: Ambulatory Visit | Attending: Adult Health | Admitting: Adult Health

## 2021-07-13 DIAGNOSIS — Z1231 Encounter for screening mammogram for malignant neoplasm of breast: Secondary | ICD-10-CM | POA: Diagnosis present

## 2021-09-01 ENCOUNTER — Other Ambulatory Visit: Payer: Self-pay | Admitting: Adult Health

## 2021-09-01 DIAGNOSIS — R591 Generalized enlarged lymph nodes: Secondary | ICD-10-CM

## 2021-09-07 ENCOUNTER — Ambulatory Visit
Admission: RE | Admit: 2021-09-07 | Discharge: 2021-09-07 | Disposition: A | Discharge status: Home / Self Care | Payer: Medicare (Managed Care) | Source: Ambulatory Visit | Attending: Adult Health | Admitting: Adult Health

## 2021-09-07 DIAGNOSIS — R591 Generalized enlarged lymph nodes: Secondary | ICD-10-CM | POA: Diagnosis not present

## 2021-09-13 ENCOUNTER — Encounter: Payer: Self-pay | Admitting: Surgery

## 2021-09-13 ENCOUNTER — Ambulatory Visit (INDEPENDENT_AMBULATORY_CARE_PROVIDER_SITE_OTHER): Payer: Medicare (Managed Care) | Admitting: Surgery

## 2021-09-13 VITALS — BP 135/69 | HR 71 | Temp 98.2°F | Wt 141.0 lb

## 2021-09-13 DIAGNOSIS — M7989 Other specified soft tissue disorders: Secondary | ICD-10-CM

## 2021-09-13 DIAGNOSIS — M25522 Pain in left elbow: Secondary | ICD-10-CM | POA: Diagnosis not present

## 2021-09-13 NOTE — Patient Instructions (Addendum)
We will schedule you for an MRI of your left elbow. La hemos programado para una resonancia magntica de su codo izquierdo.  You are scheduled for your MRI at [facility] on Thursday 6/22. You will need to arrive at the [facility] entrance at 8:30 am.  su MRI est programada para el [facility] el 22/6/23. deber llegar a la entrada del [facility] a las 8:30 am.  we will have you follow up here after we get the results of the MRI. Please see your appointment below. le haremos un seguimiento aqu despus de que obtengamos los resultados de la Health visitor. Por favor, vea su cita a continuacin.

## 2021-09-15 ENCOUNTER — Encounter: Payer: Self-pay | Admitting: Surgery

## 2021-09-15 NOTE — Progress Notes (Signed)
Patient ID: Leah Luna, female   DOB: July 14, 1955, 66 y.o.   MRN: [id number]  HPI Leah Luna is a 66 y.o. female seen in consultation for a soft tissue mass on the left cubital fossa.  She states that has been present for several months.  Seems to be increasing in size.  She develops nonspecific left upper extremity complaints with paresthesias and some intermittent pains that are sharp mild.  They are not debilitating.  There is no significant loss of function on the left hand or left upper arm. She also reports some swallowing issues with flying and globus sensation.  She has been seen at [facility] by both endocrinology, ENT and pulmonary medicine.  He has had prior FNAs that are benign.  She does have a history of multinodular goiter Specific pathology has been found TSH was normal.  BMP and CBC is normal. He has had a lot of psychosocial stress.  She does have a history of significant depression and anxiety that is being chronic and is affecting her quality of life    HPI PMHx: HTN MDD Anxiety Multinodular goiter   History reviewed. No pertinent surgical history.  Family History  Problem Relation Age of Onset   Breast cancer Neg Hx     Social History Social History   Tobacco Use   Smoking status: Never    Passive exposure: Never   Smokeless tobacco: Never  Vaping Use   Vaping Use: Never used  Substance Use Topics   Alcohol use: No    Alcohol/week: 0.0 standard drinks of alcohol   Drug use: No    Allergies  Allergen Reactions   Amoxicillin Other (See Comments) and Rash   Eggs Or Egg-Derived Products Other (See Comments)    Headache   Pork-Derived Products Other (See Comments)    Migraines     Current Outpatient Medications  Medication Sig Dispense Refill   acetaminophen (TYLENOL) 650 MG CR tablet Take 650 mg by mouth every 8 (eight) hours as needed for pain.     amLODipine (NORVASC) 5 MG tablet Take 5 mg by mouth daily.     busPIRone  (BUSPAR) 7.5 MG tablet Take 7.5 mg by mouth 2 (two) times daily.     CALCIUM ANTACID 500 MG chewable tablet Chew by mouth.     clonazePAM (KLONOPIN) 1 MG tablet Take 1 mg by mouth 2 (two) times daily as needed for anxiety.     diclofenac Sodium (VOLTAREN) 1 % GEL Apply topically 4 (four) times daily.     ergocalciferol (VITAMIN D2) 1.25 MG (50000 UT) capsule Take 50,000 Units by mouth once a week.     fluticasone (FLONASE) 50 MCG/ACT nasal spray Place into both nostrils daily.     gabapentin (NEURONTIN) 100 MG capsule Take 100 mg by mouth at bedtime.     hydrochlorothiazide (HYDRODIURIL) 12.5 MG tablet Take 12.5 mg by mouth daily.     lidocaine (LIDODERM) 5 % Place 1 patch onto the skin daily. Remove & Discard patch within 12 hours or as directed by MD     Lidocaine HCl (ASPERCREME LIDOCAINE) 4 % CREA Apply topically.     methimazole (TAPAZOLE) 5 MG tablet Take 5 mg by mouth daily.     omeprazole (PRILOSEC) 40 MG capsule Take 40 mg by mouth daily.     sertraline (ZOLOFT) 100 MG tablet Take 100 mg by mouth daily.     traMADol (ULTRAM) 50 MG tablet Take 50 mg by mouth 2 (  two) times daily as needed.     No current facility-administered medications for this visit.     Review of Systems Full ROS  was asked and was negative except for the information on the HPI  Physical Exam Blood pressure 135/69, pulse 71, temperature 98.2 F (36.8 C), weight 141 lb (64 kg), last menstrual period 03/03/2004, SpO2 97 %. CONSTITUTIONAL: NAD. EYES: Pupils are equal, round,  Sclera are non-icteric. EARS, NOSE, MOUTH AND THROAT: T. The oral mucosa is pink and moist. Hearing is intact to voice. LYMPH NODES:  Lymph nodes in the neck are normal. RESPIRATORY:  Lungs are clear. There is normal respiratory effort, with equal breath sounds bilaterally, and without pathologic use of accessory muscles. Neck: Caroid is full bilaterally.  There is no specific masses.  There is no specific lymphadenopathy Axilla show no  evidence of lymphadenopathy. CARDIOVASCULAR: Heart is regular without murmurs, gallops, or rubs. GI: The abdomen is  soft, nontender, and nondistended. There are no palpable masses. There is no hepatosplenomegaly. There are normal bowel sounds in all quadrants. GU: Rectal deferred.   MUSCULOSKELETAL:There is evidence of a soft tissue mass on the cubital fossa.  It is soft and mobile.  I cannot fully determine whether this is a lipoma or is some articular  fluid SKIN: There is evidence of several epidermal inclusion cyst on the left forearm and also lower extremities.  This sizes vary and are about 1 cm. NEUROLOGIC: Motor and sensation is grossly normal. Cranial nerves are grossly intact. PSYCH:  Oriented to person, place and time. Affect is normal.  Data Reviewed  I have personally reviewed the patient's imaging, laboratory findings and medical records.    Assessment/Plan 66 year old female with cubital fossa soft tissue mass.  We will need further imaging studies in the form of MRI to determine the proximity to the joint and whether or not this is coming from the joint or other adjacent soft tissue.  Discussed with the patient in detail about her disease process.  Extensive counseling provided.  She does have some small epidermal inclusion cyst on the forearms and lower extremities that currently are not causing any major issues.  I would like to further study the soft tissue mass within the left cubital fossa before anything else.  There is no need for emergent surgical intervention at this time.  I spent 60 minutes in this encounter including coordination of her care, personally reviewing imaging studies and medical records, placing orders and performing appropriate accommodation    Sterling Big, MD FACS General Surgeon 09/15/2021, 11:28 AM

## 2021-09-23 ENCOUNTER — Ambulatory Visit
Admission: RE | Admit: 2021-09-23 | Discharge: 2021-09-23 | Disposition: A | Discharge status: Home / Self Care | Payer: Medicare (Managed Care) | Source: Ambulatory Visit | Attending: Surgery | Admitting: Surgery

## 2021-09-23 DIAGNOSIS — M7989 Other specified soft tissue disorders: Secondary | ICD-10-CM | POA: Insufficient documentation

## 2021-09-23 DIAGNOSIS — M25522 Pain in left elbow: Secondary | ICD-10-CM | POA: Insufficient documentation

## 2021-09-23 MED ORDER — GADOBUTROL 1 MMOL/ML IV SOLN
6.0000 mL | Freq: Once | INTRAVENOUS | Status: AC | PRN
  Administered 2021-09-23: 6 mL via INTRAVENOUS

## 2021-09-24 ENCOUNTER — Telehealth: Payer: Self-pay

## 2021-09-27 ENCOUNTER — Ambulatory Visit: Payer: Medicare (Managed Care) | Admitting: Surgery

## 2021-10-18 ENCOUNTER — Encounter: Payer: Self-pay | Admitting: Surgery

## 2021-10-18 ENCOUNTER — Ambulatory Visit (INDEPENDENT_AMBULATORY_CARE_PROVIDER_SITE_OTHER): Payer: Medicare (Managed Care) | Admitting: Surgery

## 2021-10-18 ENCOUNTER — Other Ambulatory Visit: Payer: Self-pay

## 2021-10-18 VITALS — BP 125/76 | HR 73 | Temp 98.5°F | Wt 141.6 lb

## 2021-10-18 DIAGNOSIS — M25522 Pain in left elbow: Secondary | ICD-10-CM | POA: Diagnosis not present

## 2021-10-18 DIAGNOSIS — M7989 Other specified soft tissue disorders: Secondary | ICD-10-CM

## 2021-10-18 NOTE — Patient Instructions (Signed)
We have referred you to [facility]. They will call you to schedule this appointment.   Their phone number is [phone].

## 2021-10-21 NOTE — Progress Notes (Signed)
Outpatient Surgical Follow Up  10/21/2021  Leah Luna is an 66 y.o. female.   Chief Complaint  Patient presents with   Follow-up    Enlarged lymph nodes-left arm    HPI: Leah Luna is a 66 year old female well-known to me presents with a soft tissue mass around the left elbow.  I have performed an MRI that have personally reviewed that failed to review any masses.  She continues to have intermittent pain and she states that when she presses on that fullness on the left arm she feels tingling and some discomfort on her left hand.  History reviewed. No pertinent past medical history.  History reviewed. No pertinent surgical history.  Family History  Problem Relation Age of Onset   Breast cancer Neg Hx     Social History:  reports that she has never smoked. She has never been exposed to tobacco smoke. She has never used smokeless tobacco. She reports that she does not drink alcohol and does not use drugs.  Allergies:  Allergies  Allergen Reactions   Amoxicillin Other (See Comments) and Rash   Eggs Or Egg-Derived Products Other (See Comments)    Headache   Pork-Derived Products Other (See Comments)    Migraines     Medications reviewed.    ROS Full ROS performed and is otherwise negative other than what is stated in HPI   BP 125/76   Pulse 73   Temp 98.5 F (36.9 C) (Oral)   Wt 141 lb 9.6 oz (64.2 kg)   LMP 03/03/2004   SpO2 98%   BMI 23.56 kg/m   Physical Exam Vitals and nursing note reviewed. Exam conducted with a chaperone present.  Constitutional:      General: She is not in acute distress.    Appearance: Normal appearance.  Cardiovascular:     Rate and Rhythm: Normal rate and regular rhythm.     Heart sounds: No murmur heard. Pulmonary:     Effort: Pulmonary effort is normal. No respiratory distress.     Breath sounds: Normal breath sounds. No stridor. No wheezing or rhonchi.  Musculoskeletal:        General: No swelling or tenderness.  Normal range of motion.     Cervical back: Normal range of motion and neck supple. No rigidity or tenderness.     Comments: There is evidence of a fullness medial to the left bicipital tendon above the medial epicondyle. Seems in essence of a lipoma.  She does have some fullness on the contralateral side which makes me question the actuality of a mass  Lymphadenopathy:     Cervical: No cervical adenopathy.  Skin:    General: Skin is warm and dry.     Capillary Refill: Capillary refill takes less than 2 seconds.  Neurological:     General: No focal deficit present.     Mental Status: She is alert and oriented to person, place, and time.  Psychiatric:        Mood and Affect: Mood normal.        Behavior: Behavior normal.        Thought Content: Thought content normal.        Judgment: Judgment normal.       Assessment/Plan:  66 year old female with a fullness medial to left bicipital tendon.  On exam seems to be consistent with a lipoma but I am not certain.  MRI certainly does not show any abnormality.  Given the complexity of the situation I think that  this is better evaluated by a hand specialist.  I have made arrangements for her to see one of her local hand surgeons. She understands the situation I spent 30 minutes in this encounter including personally reviewing imaging studies, coordinating her care, placing orders and performing a proper documentation   Sterling Big, MD [facility] General Surgeon

## 2021-10-26 ENCOUNTER — Telehealth: Payer: Self-pay

## 2021-10-26 NOTE — Telephone Encounter (Signed)
Spoke with patient spouse- let her know referral notes have been faxed to [phone]- [facility]-someone form their office will call to schedule an appointment.

## 2022-04-28 ENCOUNTER — Other Ambulatory Visit: Payer: Self-pay | Admitting: Family Medicine

## 2022-04-28 DIAGNOSIS — Z1231 Encounter for screening mammogram for malignant neoplasm of breast: Secondary | ICD-10-CM

## 2022-07-15 ENCOUNTER — Ambulatory Visit
Admission: RE | Admit: 2022-07-15 | Discharge: 2022-07-15 | Disposition: A | Discharge status: Home / Self Care | Payer: Medicare (Managed Care) | Source: Ambulatory Visit | Attending: Family Medicine | Admitting: Family Medicine

## 2022-07-15 DIAGNOSIS — Z1231 Encounter for screening mammogram for malignant neoplasm of breast: Secondary | ICD-10-CM | POA: Insufficient documentation

## 2022-09-18 ENCOUNTER — Emergency Department
Admission: EM | Admit: 2022-09-18 | Discharge: 2022-09-18 | Disposition: A | Discharge status: Home / Self Care | Payer: Medicare (Managed Care) | Attending: Student in an Organized Health Care Education/Training Program | Admitting: Student in an Organized Health Care Education/Training Program

## 2022-09-18 ENCOUNTER — Other Ambulatory Visit: Payer: Self-pay

## 2022-09-18 ENCOUNTER — Emergency Department: Payer: Medicare (Managed Care)

## 2022-09-18 DIAGNOSIS — R0602 Shortness of breath: Secondary | ICD-10-CM | POA: Insufficient documentation

## 2022-09-18 DIAGNOSIS — M542 Cervicalgia: Secondary | ICD-10-CM | POA: Diagnosis present

## 2022-09-18 DIAGNOSIS — S161XXA Strain of muscle, fascia and tendon at neck level, initial encounter: Secondary | ICD-10-CM | POA: Insufficient documentation

## 2022-09-18 DIAGNOSIS — M79605 Pain in left leg: Secondary | ICD-10-CM | POA: Insufficient documentation

## 2022-09-18 DIAGNOSIS — X58XXXA Exposure to other specified factors, initial encounter: Secondary | ICD-10-CM | POA: Insufficient documentation

## 2022-09-18 DIAGNOSIS — R9431 Abnormal electrocardiogram [ECG] [EKG]: Secondary | ICD-10-CM | POA: Diagnosis not present

## 2022-09-18 DIAGNOSIS — R0789 Other chest pain: Secondary | ICD-10-CM | POA: Diagnosis not present

## 2022-09-18 DIAGNOSIS — M549 Dorsalgia, unspecified: Secondary | ICD-10-CM | POA: Diagnosis not present

## 2022-09-18 LAB — TROPONIN I (HIGH SENSITIVITY)
Troponin I (High Sensitivity): 3 ng/L (ref <18)
Troponin I (High Sensitivity): 2 ng/L (ref <18)

## 2022-09-18 LAB — CBC WITH DIFFERENTIAL/PLATELET
Abs Immature Granulocytes: 0.01 10*3/uL (ref 0.00–0.07)
Basophils Absolute: 0 10*3/uL (ref 0.0–0.1)
Basophils Relative: 1 %
Eosinophils Absolute: 0.1 10*3/uL (ref 0.0–0.5)
Eosinophils Relative: 3 %
HCT: 40.2 % (ref 36.0–46.0)
Hemoglobin: 13.1 g/dL (ref 12.0–15.0)
Immature Granulocytes: 0 %
Lymphocytes Relative: 36 %
Lymphs Abs: 1.9 10*3/uL (ref 0.7–4.0)
MCH: 28.3 pg (ref 26.0–34.0)
MCHC: 32.6 g/dL (ref 30.0–36.0)
MCV: 86.8 fL (ref 80.0–100.0)
Monocytes Absolute: 0.4 10*3/uL (ref 0.1–1.0)
Monocytes Relative: 7 %
Neutro Abs: 2.8 10*3/uL (ref 1.7–7.7)
Neutrophils Relative %: 53 %
Platelets: 219 10*3/uL (ref 150–400)
RBC: 4.63 MIL/uL (ref 3.87–5.11)
RDW: 12.9 % (ref 11.5–15.5)
WBC: 5.2 10*3/uL (ref 4.0–10.5)
nRBC: 0 % (ref 0.0–0.2)

## 2022-09-18 LAB — TSH: TSH: 2.253 u[IU]/mL (ref 0.350–4.500)

## 2022-09-18 LAB — BASIC METABOLIC PANEL
Anion gap: 11 (ref 5–15)
BUN: 17 mg/dL (ref 8–23)
CO2: 26 mmol/L (ref 22–32)
Calcium: 9.4 mg/dL (ref 8.9–10.3)
Chloride: 102 mmol/L (ref 98–111)
Creatinine, Ser: 0.81 mg/dL (ref 0.44–1.00)
GFR, Estimated: >60 mL/min (ref >60)
Glucose, Bld: 163 mg/dL — ABNORMAL HIGH (ref 70–99)
Potassium: 3.4 mmol/L — ABNORMAL LOW (ref 3.5–5.1)
Sodium: 139 mmol/L (ref 135–145)

## 2022-09-18 MED ORDER — IBUPROFEN 600 MG PO TABS
600.0000 mg | ORAL_TABLET | Freq: Once | ORAL | Status: AC
  Administered 2022-09-18: 600 mg via ORAL
  Filled 2022-09-18: qty 1

## 2022-09-18 MED ORDER — IBUPROFEN 600 MG PO TABS: 600.0000 mg | ORAL_TABLET | Freq: Three times a day (TID) | ORAL | 0 refills | Status: DC | PRN

## 2022-09-18 NOTE — Discharge Instructions (Addendum)
Start taking the Ibuprofen, ideally 2-3 x daily, for 2-3 days then as needed  Try to sleep in a neutral position, on your back  Discuss with your primary doctor

## 2022-09-18 NOTE — ED Triage Notes (Signed)
Patient reports LEFT sided chest pain that started this morning and radiates down her LEFT arm, up her neck; Also reports LEFT leg pain and back pain; Denies cough/fevers; No history personal cardiac history

## 2022-09-18 NOTE — ED Provider Notes (Signed)
[facility] Provider Note    Event Date/Time   First MD Initiated Contact with Patient 09/18/22 1946     (approximate)   History   Chest Pain (Patient reports LEFT sided chest pain that started this morning and radiates down her LEFT arm, up her neck; Also reports LEFT leg pain and back pain; Denies cough/fevers; No history personal cardiac history)   HPI  Leah Luna is a 67 y.o. female  here with chest and neck pain. Pt reports that earlier today, while cooking, she began to experience aching, throbbing left upper neck/chest pain. Pain radiated to her arm and leg with a tingling sensation and she began to feel mildly SOB though states she was anxious at the time as well. The aching/stiffness in her upper chest and neck remained so she presents for evaluation. Minimal pain now. She does report a h/o thyroid issues/"enlargement" as well as increased stress recently.        Physical Exam   Triage Vital Signs: ED Triage Vitals [09/18/22 1650]  Enc Vitals Group     BP (!) 164/82     Pulse Rate 90     Resp 19     Temp 98.3 F (36.8 C)     Temp Source Oral     SpO2 98 %     Weight 143 lb (64.9 kg)     Height      Head Circumference      Peak Flow      Pain Score      Pain Loc      Pain Edu?      Excl. in GC?     Most recent vital signs: Vitals:   09/18/22 2130 09/18/22 2146  BP: 134/71   Pulse: 70   Resp: 19   Temp:  98.3 F (36.8 C)  SpO2: 99%      General: Awake, no distress.  CV:  Good peripheral perfusion. RRR. Pulses 2+ and symmetric bilateral UE and LE. Resp:  Normal work of breathing. Lungs CTAB. Abd:  No distention. No tenderness. No rebound or guarding. Other:  Moderate L paracervical TTP with +Spurling's on left, reproduction of her pain. Also appears somewhat anxious, tearful when discussing stressors.   ED Results / Procedures / Treatments   Labs (all labs ordered are listed, but only abnormal results are  displayed) Labs Reviewed  BASIC METABOLIC PANEL - Abnormal; Notable for the following components:      Result Value   Potassium 3.4 (*)    Glucose, Bld 163 (*)    All other components within normal limits  CBC WITH DIFFERENTIAL/PLATELET  TSH  TROPONIN I (HIGH SENSITIVITY)  TROPONIN I (HIGH SENSITIVITY)     EKG Normal sinus rhythm. VR 90. PR 158, QRS 86, QTc 459. No acute ST elevations or depressions. Non specific ST changes in lateral leads.   RADIOLOGY CXR: Clear   I also independently reviewed and agree with radiologist interpretations.   PROCEDURES:  Critical Care performed: No  .1-3 Lead EKG Interpretation  Performed by: Shaune Pollack, MD Authorized by: Shaune Pollack, MD     Interpretation: normal     ECG rate:  80-90   ECG rate assessment: normal     Rhythm: sinus rhythm     Ectopy: aberrant     Conduction: normal   Comments:     Indication: chest pain     MEDICATIONS ORDERED IN ED: Medications  ibuprofen (ADVIL) tablet 600 mg (  600 mg Oral Given 09/18/22 2111)     IMPRESSION / MDM / ASSESSMENT AND PLAN / ED COURSE  I reviewed the triage vital signs and the nursing notes.                              Differential diagnosis includes, but is not limited to, ACS, cervical radiculopathy/cervical pain, anxiety, panic attack, GERD, PNA, PTX  Patient's presentation is most consistent with acute presentation with potential threat to life or bodily function.  The patient is on the cardiac monitor to evaluate for evidence of arrhythmia and/or significant heart rate changes  67 yo F here with atypical chest and neck pain. Clinically, suspect cervical radicular pain, versus sx 2/2 significant emotional stressors/anxiety. EKG is nonischemic, trop neg x 2 - do not suspect ACS. Pain is dull, reproducible with palpation of neck and not c/w dissection. No hypoxia or signs of PE. Labs otherwise very reassuring.  CBC unremarkable. BMP with normal renal function. CXR  reviewed and is clear. She feels improved in ED with reassurance.  Will treat with NSAIDs, outpt follow-up and good return precautions.   FINAL CLINICAL IMPRESSION(S) / ED DIAGNOSES   Final diagnoses:  Atypical chest pain  Strain of neck muscle, initial encounter     Rx / DC Orders   ED Discharge Orders          Ordered    ibuprofen (ADVIL) 600 MG tablet  Every 8 hours PRN        09/18/22 2132             Note:  This document was prepared using Dragon voice recognition software and may include unintentional dictation errors.   Shaune Pollack, MD 09/18/22 [phone]

## 2022-12-28 ENCOUNTER — Ambulatory Visit
Admission: RE | Admit: 2022-12-28 | Discharge: 2022-12-28 | Disposition: A | Discharge status: Home / Self Care | Payer: Medicare (Managed Care) | Source: Ambulatory Visit | Attending: Family Medicine | Admitting: Family Medicine

## 2022-12-28 ENCOUNTER — Other Ambulatory Visit: Payer: Self-pay | Admitting: Family Medicine

## 2022-12-28 DIAGNOSIS — R109 Unspecified abdominal pain: Secondary | ICD-10-CM | POA: Insufficient documentation

## 2022-12-28 DIAGNOSIS — R1011 Right upper quadrant pain: Secondary | ICD-10-CM

## 2022-12-28 DIAGNOSIS — R10A1 Flank pain, right side: Secondary | ICD-10-CM

## 2023-03-12 ENCOUNTER — Other Ambulatory Visit: Payer: Self-pay

## 2023-03-12 ENCOUNTER — Emergency Department
Admission: EM | Admit: 2023-03-12 | Discharge: 2023-03-12 | Disposition: A | Discharge status: Home / Self Care | Payer: Medicare (Managed Care) | Attending: Emergency Medicine | Admitting: Emergency Medicine

## 2023-03-12 DIAGNOSIS — H9203 Otalgia, bilateral: Secondary | ICD-10-CM | POA: Diagnosis not present

## 2023-03-12 DIAGNOSIS — I10 Essential (primary) hypertension: Secondary | ICD-10-CM | POA: Insufficient documentation

## 2023-03-12 DIAGNOSIS — Z79899 Other long term (current) drug therapy: Secondary | ICD-10-CM | POA: Diagnosis not present

## 2023-03-12 DIAGNOSIS — J029 Acute pharyngitis, unspecified: Secondary | ICD-10-CM | POA: Diagnosis present

## 2023-03-12 DIAGNOSIS — Z20822 Contact with and (suspected) exposure to covid-19: Secondary | ICD-10-CM | POA: Insufficient documentation

## 2023-03-12 LAB — GROUP A STREP BY PCR: Group A Strep by PCR: NOT DETECTED

## 2023-03-12 LAB — RESP PANEL BY RT-PCR (RSV, FLU A&B, COVID)  RVPGX2
Influenza A by PCR: NEGATIVE
Influenza B by PCR: NEGATIVE
Resp Syncytial Virus by PCR: NEGATIVE
SARS Coronavirus 2 by RT PCR: NEGATIVE

## 2023-03-12 MED ORDER — KETOROLAC TROMETHAMINE 30 MG/ML IJ SOLN
60.0000 mg | Freq: Once | INTRAMUSCULAR | Status: AC
  Administered 2023-03-12: 60 mg via INTRAMUSCULAR
  Filled 2023-03-12: qty 2

## 2023-03-12 MED ORDER — DEXAMETHASONE SODIUM PHOSPHATE 10 MG/ML IJ SOLN
10.0000 mg | Freq: Once | INTRAMUSCULAR | Status: AC
  Administered 2023-03-12: 10 mg via INTRAMUSCULAR
  Filled 2023-03-12: qty 1

## 2023-03-12 MED ORDER — LIDOCAINE VISCOUS HCL 2 % MT SOLN: 15.0000 mL | OROMUCOSAL | 0 refills | Status: AC | PRN

## 2023-03-12 MED ORDER — LIDOCAINE VISCOUS HCL 2 % MT SOLN
15.0000 mL | Freq: Once | OROMUCOSAL | Status: AC
  Administered 2023-03-12: 15 mL via OROMUCOSAL
  Filled 2023-03-12: qty 15

## 2023-03-12 MED ORDER — IBUPROFEN 800 MG PO TABS: 800.0000 mg | ORAL_TABLET | Freq: Three times a day (TID) | ORAL | 0 refills | Status: AC | PRN

## 2023-03-12 NOTE — Discharge Instructions (Addendum)
You may alternate Tylenol 1000 mg every 6 hours as needed for pain, fever and Ibuprofen 800 mg every 6-8 hours as needed for pain, fever.  Please take Ibuprofen with food.  Do not take more than 4000 mg of Tylenol (acetaminophen) in a 24 hour period.   Your COVID, flu, RSV and strep test stoday were negative.   You may use over-the-counter Chloraseptic spray to help with sore throat, warm salt water gargles and throat lozenges.

## 2023-03-12 NOTE — ED Notes (Signed)
[facility] staff [facility] called and advised this pt has a fever, sore throat and inability to swallow. Number to call for meds #[phone] ([facility]).

## 2023-03-12 NOTE — ED Triage Notes (Signed)
Pt to ed from home via POV with family for sore throat and fever that started yesterday. Pt is caox4, in no acute distress and ambulatory in triage. Pt is able to maintain her own secretions at this time.

## 2023-03-12 NOTE — ED Provider Notes (Signed)
[facility] Provider Note    Event Date/Time   First MD Initiated Contact with Patient 03/12/23 0206     (approximate)   History   Sore Throat   HPI  Leah Luna is a 67 y.o. 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  She has pain with swallowing but is able to manage her secretions.  No difficulty breathing or speaking.  No fever.  Reports bilateral ear pain.  No cough or congestion.   History provided by patient, son using Spanish interpreter.    History reviewed. No pertinent past medical history.  History reviewed. No pertinent surgical history.  MEDICATIONS:  Prior to Admission medications   Medication Sig Start Date End Date Taking? Authorizing Provider  ibuprofen (ADVIL) 800 MG tablet Take 1 tablet (800 mg total) by mouth every 8 (eight) hours as needed. 03/12/23  Yes Tatiyanna Lashley N, DO  lidocaine (XYLOCAINE) 2 % solution Use as directed 15 mLs in the mouth or throat every 4 (four) hours as needed for mouth pain. 03/12/23  Yes Keionte Swicegood, Layla Maw, DO  acetaminophen (TYLENOL) 650 MG CR tablet Take 650 mg by mouth every 8 (eight) hours as needed for pain.    [provider]  amLODipine (NORVASC) 5 MG tablet Take 5 mg by mouth daily.    [provider]  busPIRone (BUSPAR) 7.5 MG tablet Take 7.5 mg by mouth 2 (two) times daily.    [provider]  CALCIUM ANTACID 500 MG chewable tablet Chew by mouth. 09/02/21   [provider]  clonazePAM (KLONOPIN) 1 MG tablet Take 1 mg by mouth 2 (two) times daily as needed for anxiety.    [provider]  diclofenac Sodium (VOLTAREN) 1 % GEL Apply topically 4 (four) times daily.    [provider]  ergocalciferol (VITAMIN D2) 1.25 MG (50000 UT) capsule Take 50,000 Units by mouth once a week.    [provider]   fluticasone (FLONASE) 50 MCG/ACT nasal spray Place into both nostrils daily.    [provider]  gabapentin (NEURONTIN) 100 MG capsule Take 100 mg by mouth at bedtime.    [provider]  hydrochlorothiazide (HYDRODIURIL) 12.5 MG tablet Take 12.5 mg by mouth daily.    [provider]  lidocaine (LIDODERM) 5 % Place 1 patch onto the skin daily. Remove & Discard patch within 12 hours or as directed by MD    [provider]  Lidocaine HCl (ASPERCREME LIDOCAINE) 4 % CREA Apply topically.    [provider]  methimazole (TAPAZOLE) 5 MG tablet Take 5 mg by mouth daily.    [provider]  omeprazole (PRILOSEC) 40 MG capsule Take 40 mg by mouth daily.    [provider]  sertraline (ZOLOFT) 100 MG tablet Take 100 mg by mouth daily.    [provider]  traMADol (ULTRAM) 50 MG tablet Take 50 mg by mouth 2 (two) times daily as needed. 09/01/21   [provider]    Physical Exam   Triage Vital Signs: ED Triage Vitals  Encounter Vitals Group     BP 03/12/23 0026 (!) 143/75     Systolic BP Percentile --      Diastolic BP Percentile --      Pulse Rate 03/12/23 0026 90     Resp 03/12/23 0026 19     Temp 03/12/23  0026 98 F (36.7 C)     Temp Source 03/12/23 0026 Oral     SpO2 03/12/23 0026 93 %     Weight --      Height --      Head Circumference --      Peak Flow --      Pain Score 03/12/23 0029 8     Pain Loc --      Pain Education --      Exclude from Growth Chart --     Most recent vital signs: Vitals:   03/12/23 0026  BP: (!) 143/75  Pulse: 90  Resp: 19  Temp: 98 F (36.7 C)  SpO2: 93%    CONSTITUTIONAL: Alert, responds appropriately to questions. Well-appearing; well-nourished HEAD: Normocephalic, atraumatic EYES: Conjunctivae clear, pupils appear equal, sclera nonicteric ENT: normal nose; moist mucous membranes; mild pharyngeal erythema without petechiae, no tonsillar hypertrophy or exudate, no  uvular deviation, no unilateral swelling in posterior oropharynx, no trismus or drooling, no muffled voice, normal phonation, no stridor, airway patent.  Managing her own secretions without difficulty. NECK: Supple, normal ROM, no meningismus, no cervical lymphadenopathy, no thyromegaly, trachea midline CARD: RRR; S1 and S2 appreciated RESP: Normal chest excursion without splinting or tachypnea; breath sounds clear and equal bilaterally; no wheezes, no rhonchi, no rales, no hypoxia or respiratory distress, speaking full sentences ABD/GI: Non-distended; soft, non-tender, no rebound, no guarding, no peritoneal signs BACK: The back appears normal EXT: Normal ROM in all joints; no deformity noted, no edema SKIN: Normal color for age and race; warm; no rash on exposed skin NEURO: Moves all extremities equally, normal speech PSYCH: The patient's mood and manner are appropriate.   ED Results / Procedures / Treatments   LABS: (all labs ordered are listed, but only abnormal results are displayed) Labs Reviewed  GROUP A STREP BY PCR  RESP PANEL BY RT-PCR (RSV, FLU A&B, COVID)  [id number]     EKG:  RADIOLOGY: My personal review and interpretation of imaging:    I have personally reviewed all radiology reports.   No results found.   PROCEDURES:  Critical Care performed: No      Procedures    IMPRESSION / MDM / ASSESSMENT AND PLAN / ED COURSE  I reviewed the triage vital signs and the nursing notes.    Patient here with pharyngitis.   DIFFERENTIAL DIAGNOSIS (includes but not limited to):   Viral pharyngitis, less likely strep, no signs clinically of tonsillitis, mononucleosis, deep space neck infection, PTA, uvulitis, airway obstruction.  She does have a history of thyroid goiter causing hyperthyroidism but no clinical signs of hyperthyroidism today.  Thyroid does not feel enlarged and there are no masses appreciated.  Trachea is midline.  Doubt malignancy, mass.   Patient's  presentation is most consistent with acute complicated illness / injury requiring diagnostic workup.   PLAN: Strep test negative.  Will obtain COVID, flu and RSV testing.  Will give Toradol, Decadron and viscous lidocaine for symptomatic relief.  Airway patent.  Voice is normal.  She is managing her own secretions.   MEDICATIONS GIVEN IN ED: Medications  ketorolac (TORADOL) 30 MG/ML injection 60 mg (60 mg Intramuscular Given 03/12/23 0316)  dexamethasone (DECADRON) injection 10 mg (10 mg Intramuscular Given 03/12/23 0316)  lidocaine (XYLOCAINE) 2 % viscous mouth solution 15 mL (15 mLs Mouth/Throat Given 03/12/23 0316)     ED COURSE: Patient reports feeling better.  Tolerating p.o. here.  COVID, flu and RSV negative.  I suspect  this is viral in nature.  No indication for antibiotics today.  I feel she is safe to be discharged home.  Recommended Tylenol, Motrin.  Will discharge with viscous lidocaine.   At this time, I do not feel there is any life-threatening condition present. I reviewed all nursing notes, vitals, pertinent previous records.  All lab and urine results, EKGs, imaging ordered have been independently reviewed and interpreted by myself.  I reviewed all available radiology reports from any imaging ordered this visit.  Based on my assessment, I feel the patient is safe to be discharged home without further emergent workup and can continue workup as an outpatient as needed. Discussed all findings, treatment plan as well as usual and customary return precautions.  They verbalize understanding and are comfortable with this plan.  Outpatient follow-up has been provided as needed.  All questions have been answered.    CONSULTS:  none   OUTSIDE RECORDS REVIEWED: Reviewed last endocrinology note on 02/09/2023.       FINAL CLINICAL IMPRESSION(S) / ED DIAGNOSES   Final diagnoses:  Viral pharyngitis     Rx / DC Orders   ED Discharge Orders          Ordered    ibuprofen (ADVIL)  800 MG tablet  Every 8 hours PRN        03/12/23 0234    lidocaine (XYLOCAINE) 2 % solution  Every 4 hours PRN        03/12/23 0234             Note:  This document was prepared using Dragon voice recognition software and may include unintentional dictation errors.   Andon Villard, Layla Maw, DO 03/12/23 0330

## 2023-05-18 ENCOUNTER — Other Ambulatory Visit: Payer: Self-pay | Admitting: Family Medicine

## 2023-05-18 DIAGNOSIS — Z1231 Encounter for screening mammogram for malignant neoplasm of breast: Secondary | ICD-10-CM

## 2023-07-17 ENCOUNTER — Ambulatory Visit
Admission: RE | Admit: 2023-07-17 | Discharge: 2023-07-17 | Disposition: A | Discharge status: Home / Self Care | Payer: Medicare (Managed Care) | Source: Ambulatory Visit | Attending: Family Medicine | Admitting: Family Medicine

## 2023-07-17 DIAGNOSIS — Z1231 Encounter for screening mammogram for malignant neoplasm of breast: Secondary | ICD-10-CM | POA: Diagnosis present

## 2023-09-18 ENCOUNTER — Observation Stay (HOSPITAL_BASED_OUTPATIENT_CLINIC_OR_DEPARTMENT_OTHER)
Admit: 2023-09-18 | Discharge: 2023-09-18 | Disposition: A | Discharge status: Home / Self Care | Attending: Internal Medicine | Admitting: Internal Medicine

## 2023-09-18 ENCOUNTER — Observation Stay
Admission: EM | Admit: 2023-09-18 | Discharge: 2023-09-19 | Disposition: A | Discharge status: Home / Self Care | Attending: Family Medicine | Admitting: Family Medicine

## 2023-09-18 ENCOUNTER — Emergency Department

## 2023-09-18 ENCOUNTER — Other Ambulatory Visit: Payer: Self-pay

## 2023-09-18 DIAGNOSIS — R079 Chest pain, unspecified: Secondary | ICD-10-CM

## 2023-09-18 DIAGNOSIS — I700 Atherosclerosis of aorta: Secondary | ICD-10-CM | POA: Diagnosis not present

## 2023-09-18 DIAGNOSIS — F419 Anxiety disorder, unspecified: Secondary | ICD-10-CM | POA: Insufficient documentation

## 2023-09-18 DIAGNOSIS — R55 Syncope and collapse: Secondary | ICD-10-CM | POA: Diagnosis not present

## 2023-09-18 DIAGNOSIS — R202 Paresthesia of skin: Secondary | ICD-10-CM | POA: Diagnosis present

## 2023-09-18 DIAGNOSIS — R200 Anesthesia of skin: Secondary | ICD-10-CM | POA: Diagnosis not present

## 2023-09-18 DIAGNOSIS — Z7982 Long term (current) use of aspirin: Secondary | ICD-10-CM | POA: Diagnosis not present

## 2023-09-18 DIAGNOSIS — I639 Cerebral infarction, unspecified: Secondary | ICD-10-CM

## 2023-09-18 DIAGNOSIS — E0590 Thyrotoxicosis, unspecified without thyrotoxic crisis or storm: Secondary | ICD-10-CM | POA: Diagnosis not present

## 2023-09-18 DIAGNOSIS — G459 Transient cerebral ischemic attack, unspecified: Secondary | ICD-10-CM

## 2023-09-18 DIAGNOSIS — Z6832 Body mass index (BMI) 32.0-32.9, adult: Secondary | ICD-10-CM | POA: Diagnosis not present

## 2023-09-18 DIAGNOSIS — F32A Depression, unspecified: Secondary | ICD-10-CM | POA: Diagnosis not present

## 2023-09-18 DIAGNOSIS — I10 Essential (primary) hypertension: Secondary | ICD-10-CM | POA: Diagnosis not present

## 2023-09-18 DIAGNOSIS — R42 Dizziness and giddiness: Secondary | ICD-10-CM

## 2023-09-18 DIAGNOSIS — Z79899 Other long term (current) drug therapy: Secondary | ICD-10-CM | POA: Diagnosis not present

## 2023-09-18 HISTORY — DX: Essential (primary) hypertension: I10

## 2023-09-18 LAB — DIFFERENTIAL
Abs Immature Granulocytes: 0.03 10*3/uL (ref 0.00–0.07)
Basophils Absolute: 0 10*3/uL (ref 0.0–0.1)
Basophils Relative: 1 %
Eosinophils Absolute: 0.1 10*3/uL (ref 0.0–0.5)
Eosinophils Relative: 2 %
Immature Granulocytes: 1 %
Lymphocytes Relative: 30 %
Lymphs Abs: 1.3 10*3/uL (ref 0.7–4.0)
Monocytes Absolute: 0.2 10*3/uL (ref 0.1–1.0)
Monocytes Relative: 5 %
Neutro Abs: 2.7 10*3/uL (ref 1.7–7.7)
Neutrophils Relative %: 61 %

## 2023-09-18 LAB — TROPONIN I (HIGH SENSITIVITY)
Troponin I (High Sensitivity): 3 ng/L (ref <18)
Troponin I (High Sensitivity): 4 ng/L (ref <18)

## 2023-09-18 LAB — COMPREHENSIVE METABOLIC PANEL WITH GFR
ALT: 15 U/L (ref 0–44)
AST: 27 U/L (ref 15–41)
Albumin: 4.5 g/dL (ref 3.5–5.0)
Alkaline Phosphatase: 70 U/L (ref 38–126)
Anion gap: 9 (ref 5–15)
BUN: 19 mg/dL (ref 8–23)
CO2: 27 mmol/L (ref 22–32)
Calcium: 9.4 mg/dL (ref 8.9–10.3)
Chloride: 104 mmol/L (ref 98–111)
Creatinine, Ser: 0.66 mg/dL (ref 0.44–1.00)
GFR, Estimated: >60 mL/min (ref >60)
Glucose, Bld: 154 mg/dL — ABNORMAL HIGH (ref 70–99)
Potassium: 4.2 mmol/L (ref 3.5–5.1)
Sodium: 140 mmol/L (ref 135–145)
Total Bilirubin: 0.8 mg/dL (ref 0.0–1.2)
Total Protein: 8.5 g/dL — ABNORMAL HIGH (ref 6.5–8.1)

## 2023-09-18 LAB — CBC
HCT: 40.5 % (ref 36.0–46.0)
Hemoglobin: 13.7 g/dL (ref 12.0–15.0)
MCH: 28.8 pg (ref 26.0–34.0)
MCHC: 33.8 g/dL (ref 30.0–36.0)
MCV: 85.3 fL (ref 80.0–100.0)
Platelets: 236 10*3/uL (ref 150–400)
RBC: 4.75 MIL/uL (ref 3.87–5.11)
RDW: 12.6 % (ref 11.5–15.5)
WBC: 4.4 10*3/uL (ref 4.0–10.5)
nRBC: 0 % (ref 0.0–0.2)

## 2023-09-18 LAB — HEMOGLOBIN A1C
Hgb A1c MFr Bld: 6.4 % — ABNORMAL HIGH (ref 4.8–5.6)
Mean Plasma Glucose: 136.98 mg/dL

## 2023-09-18 LAB — ETHANOL: Alcohol, Ethyl (B): <15 mg/dL (ref <15)

## 2023-09-18 LAB — PROTIME-INR
INR: 0.9 (ref 0.8–1.2)
Prothrombin Time: 12.8 s (ref 11.4–15.2)

## 2023-09-18 LAB — CBG MONITORING, ED: Glucose-Capillary: 139 mg/dL — ABNORMAL HIGH (ref 70–99)

## 2023-09-18 LAB — TSH: TSH: 1.31 u[IU]/mL (ref 0.350–4.500)

## 2023-09-18 LAB — APTT: aPTT: 29 s (ref 24–36)

## 2023-09-18 MED ORDER — GABAPENTIN 100 MG PO CAPS
100.0000 mg | ORAL_CAPSULE | Freq: Every day | ORAL | Status: DC
  Administered 2023-09-18: 100 mg via ORAL
  Filled 2023-09-18: qty 1

## 2023-09-18 MED ORDER — SODIUM CHLORIDE 0.9% FLUSH: 3.0000 mL | INTRAVENOUS | Status: DC | PRN

## 2023-09-18 MED ORDER — IOHEXOL 350 MG/ML SOLN
125.0000 mL | Freq: Once | INTRAVENOUS | Status: AC | PRN
  Administered 2023-09-18: 125 mL via INTRAVENOUS

## 2023-09-18 MED ORDER — BUSPIRONE HCL 15 MG PO TABS
7.5000 mg | ORAL_TABLET | Freq: Two times a day (BID) | ORAL | Status: DC
  Administered 2023-09-18 – 2023-09-19 (×3): 7.5 mg via ORAL
  Filled 2023-09-18 (×4): qty 1

## 2023-09-18 MED ORDER — FLUTICASONE PROPIONATE 50 MCG/ACT NA SUSP
1.0000 | Freq: Every day | NASAL | Status: DC
  Filled 2023-09-18 (×2): qty 16

## 2023-09-18 MED ORDER — ASPIRIN 81 MG PO CHEW
81.0000 mg | CHEWABLE_TABLET | Freq: Every day | ORAL | Status: DC
  Administered 2023-09-19: 81 mg via ORAL
  Filled 2023-09-18: qty 1

## 2023-09-18 MED ORDER — PANTOPRAZOLE SODIUM 40 MG PO TBEC
40.0000 mg | DELAYED_RELEASE_TABLET | Freq: Every day | ORAL | Status: DC
  Administered 2023-09-18 – 2023-09-19 (×2): 40 mg via ORAL
  Filled 2023-09-18 (×2): qty 1

## 2023-09-18 MED ORDER — HYDRALAZINE HCL 20 MG/ML IJ SOLN: 5.0000 mg | Freq: Four times a day (QID) | INTRAMUSCULAR | Status: DC | PRN

## 2023-09-18 MED ORDER — ACETAMINOPHEN 325 MG PO TABS
650.0000 mg | ORAL_TABLET | Freq: Three times a day (TID) | ORAL | Status: DC | PRN
  Administered 2023-09-18: 650 mg via ORAL
  Filled 2023-09-18: qty 2

## 2023-09-18 MED ORDER — SODIUM CHLORIDE 0.9% FLUSH
3.0000 mL | Freq: Two times a day (BID) | INTRAVENOUS | Status: DC
  Administered 2023-09-18: 10 mL via INTRAVENOUS
  Administered 2023-09-18: 3 mL via INTRAVENOUS
  Administered 2023-09-19: 10 mL via INTRAVENOUS

## 2023-09-18 MED ORDER — DICLOFENAC SODIUM 1 % EX GEL
2.0000 g | Freq: Four times a day (QID) | CUTANEOUS | Status: DC
  Administered 2023-09-18 – 2023-09-19 (×4): 2 g via TOPICAL
  Filled 2023-09-18 (×2): qty 100

## 2023-09-18 MED ORDER — METHIMAZOLE 5 MG PO TABS
5.0000 mg | ORAL_TABLET | Freq: Every day | ORAL | Status: DC
  Administered 2023-09-18 – 2023-09-19 (×2): 5 mg via ORAL
  Filled 2023-09-18 (×3): qty 1

## 2023-09-18 MED ORDER — SENNOSIDES-DOCUSATE SODIUM 8.6-50 MG PO TABS
2.0000 | ORAL_TABLET | Freq: Every day | ORAL | Status: DC
  Administered 2023-09-18: 2 via ORAL
  Filled 2023-09-18: qty 2

## 2023-09-18 MED ORDER — SERTRALINE HCL 50 MG PO TABS
100.0000 mg | ORAL_TABLET | Freq: Every day | ORAL | Status: DC
  Administered 2023-09-18 – 2023-09-19 (×2): 100 mg via ORAL
  Filled 2023-09-18 (×2): qty 2

## 2023-09-18 MED ORDER — CLOPIDOGREL BISULFATE 75 MG PO TABS
75.0000 mg | ORAL_TABLET | Freq: Every day | ORAL | Status: DC
  Administered 2023-09-18 – 2023-09-19 (×2): 75 mg via ORAL
  Filled 2023-09-18 (×2): qty 1

## 2023-09-18 MED ORDER — SODIUM CHLORIDE 0.9% FLUSH
3.0000 mL | Freq: Once | INTRAVENOUS | Status: AC
  Administered 2023-09-18: 3 mL via INTRAVENOUS

## 2023-09-18 MED ORDER — STROKE: EARLY STAGES OF RECOVERY BOOK: Freq: Once | Status: AC

## 2023-09-18 MED ORDER — OLOPATADINE HCL 0.1 % OP SOLN
1.0000 [drp] | Freq: Two times a day (BID) | OPHTHALMIC | Status: DC
  Administered 2023-09-18 – 2023-09-19 (×2): 1 [drp] via OPHTHALMIC
  Filled 2023-09-18: qty 5

## 2023-09-18 NOTE — H&P (Signed)
 History and Physical    Leah Luna ZOX:[id number] DOB: 04/15/1955 DOA: 09/18/2023  PCP: Larina Plough, MD (Confirm with patient/family/NH records and if not entered, this has to be entered at [facility] point of entry) Patient coming from: Home  I have personally briefly reviewed patient's old medical records in [facility] Link  Chief Complaint: left sided numbness  HPI: Leah Luna is a 68 y.o. female with medical history significant of HTN, GERD, hyperthyroidism on methimazole, anxiety/depression, multiple OA's, presented with strokelike symptoms.  Symptoms started around 730 today, patient started to feel dizziness and then developed left-sided numbness including left arm and left hand left side of torso and left leg and foot, also including left side of the tongue and lip.  Denies any weakness of any of the limbs. ED Course: Borderline tachycardia blood pressure 150/80 saturation 98% room air.  CT head negative for acute findings, CTA head and neck negative for LVO.  Review of Systems: As per HPI otherwise 14 point review of systems negative.    Past Medical History:  Diagnosis Date   Hypertension     History reviewed. No pertinent surgical history.   reports that she has never smoked. She has never been exposed to tobacco smoke. She has never used smokeless tobacco. She reports that she does not drink alcohol and does not use drugs.  Allergies  Allergen Reactions   Amoxicillin Other (See Comments) and Rash   Oyster Shell Calcium-D Nausea Only    Other reaction(s): vomiting, pain in hands/feet; itching  Other reaction(s): vomiting, pain in hands/feet   Egg-Derived Products Other (See Comments)    Headache   Omeprazole Diarrhea   Pork-Derived Products Other (See Comments)    Migraines     Family History  Problem Relation Age of Onset   Breast cancer Neg Hx      Prior to Admission medications   Medication Sig Start Date End Date Taking?  Authorizing Provider  acetaminophen (TYLENOL) 650 MG CR tablet Take 650 mg by mouth every 8 (eight) hours as needed for pain.   Yes [provider]  amLODipine (NORVASC) 5 MG tablet Take 5 mg by mouth daily.   Yes [provider]  aspirin 81 MG chewable tablet Chew 81 mg by mouth daily. 09/03/23  Yes [provider]  busPIRone (BUSPAR) 7.5 MG tablet Take 7.5 mg by mouth 2 (two) times daily.   Yes [provider]  CALCIUM ANTACID 500 MG chewable tablet Chew by mouth. 09/02/21  Yes [provider]  diclofenac Sodium (VOLTAREN) 1 % GEL Apply topically 4 (four) times daily.   Yes [provider]  ergocalciferol (VITAMIN D2) 1.25 MG (50000 UT) capsule Take 50,000 Units by mouth once a week.   Yes [provider]  fluticasone (FLONASE) 50 MCG/ACT nasal spray Place into both nostrils daily.   Yes [provider]  gabapentin (NEURONTIN) 100 MG capsule Take 100 mg by mouth at bedtime.   Yes [provider]  hydrochlorothiazide (HYDRODIURIL) 12.5 MG tablet Take 12.5 mg by mouth daily.   Yes [provider]  ibuprofen  (ADVIL ) 800 MG tablet Take 1 tablet (800 mg total) by mouth every 8 (eight) hours as needed. 03/12/23  Yes Ward, Kristen N, DO  lidocaine  (XYLOCAINE ) 2 % solution Use as directed 15 mLs in the mouth or throat every 4 (four) hours as needed for mouth pain. 03/12/23  Yes Ward, Kristen N, DO  MAG-G 500 (27 Mg) MG TABS  tablet Take 500 mg by mouth daily.   Yes [provider]  methimazole (TAPAZOLE) 5 MG tablet Take 5 mg by mouth daily.   Yes [provider]  omeprazole (PRILOSEC) 40 MG capsule Take 40 mg by mouth daily.   Yes [provider]  PATADAY 0.1 % ophthalmic solution Place 1 drop into both eyes 2 (two) times daily. 07/05/23  Yes [provider]  SENNA PLUS 8.6-50 MG tablet Take 2 tablets by mouth at bedtime.   Yes [provider]  sertraline (ZOLOFT) 100 MG tablet  Take 100 mg by mouth daily.   Yes [provider]  clonazePAM (KLONOPIN) 1 MG tablet Take 1 mg by mouth 2 (two) times daily as needed for anxiety. Patient not taking: Reported on 09/18/2023    [provider]  lidocaine  (LIDODERM ) 5 % Place 1 patch onto the skin daily. Remove & Discard patch within 12 hours or as directed by MD Patient not taking: Reported on 09/18/2023    [provider]  Lidocaine  HCl (ASPERCREME LIDOCAINE ) 4 % CREA Apply topically. Patient not taking: Reported on 09/18/2023    [provider]  TAMIFLU 75 MG capsule Take 75 mg by mouth daily. Patient not taking: Reported on 09/18/2023 06/01/23   [provider]  traMADol (ULTRAM) 50 MG tablet Take 50 mg by mouth 2 (two) times daily as needed. Patient not taking: Reported on 09/18/2023 09/01/21   [provider]    Physical Exam: Vitals:   09/18/23 1130 09/18/23 1200 09/18/23 1230 09/18/23 1245  BP: (!) 148/76 132/74 134/73 134/73  Pulse: 94 75 74   Resp: 11 16 13    Temp:      SpO2: 96% 93% 95%   Weight:      Height:        Constitutional: NAD, calm, comfortable Vitals:   09/18/23 1130 09/18/23 1200 09/18/23 1230 09/18/23 1245  BP: (!) 148/76 132/74 134/73 134/73  Pulse: 94 75 74   Resp: 11 16 13    Temp:      SpO2: 96% 93% 95%   Weight:      Height:       Eyes: PERRL, lids and conjunctivae normal ENMT: Mucous membranes are moist. Posterior pharynx clear of any exudate or lesions.Normal dentition.  Neck: normal, supple, no masses, no thyromegaly Respiratory: clear to auscultation bilaterally, no wheezing, no crackles. Normal respiratory effort. No accessory muscle use.  Cardiovascular: Regular rate and rhythm, no murmurs / rubs / gallops. No extremity edema. 2+ pedal pulses. No carotid bruits.  Abdomen: no tenderness, no masses palpated. No hepatosplenomegaly. Bowel sounds positive.  Musculoskeletal: no clubbing / cyanosis. No joint deformity upper and lower  extremities. Good ROM, no contractures. Normal muscle tone.  Skin: no rashes, lesions, ulcers. No induration Neurologic: CN 2-12 grossly intact.  Decreased light touch sensation of left arm and left leg compared to right side, DTR normal. Strength 5/5 in all 4.  Psychiatric: Normal judgment and insight. Alert and oriented x 3. Normal mood.     Labs on Admission: I have personally reviewed following labs and imaging studies  CBC: Recent Labs  Lab 09/18/23 1028  WBC 4.4  NEUTROABS 2.7  HGB 13.7  HCT 40.5  MCV 85.3  PLT 236   Basic Metabolic Panel: Recent Labs  Lab 09/18/23 1028  NA 140  K 4.2  CL 104  CO2 27  GLUCOSE 154*  BUN 19  CREATININE 0.66  CALCIUM 9.4   GFR: Estimated Creatinine  Clearance: 65.7 mL/min (by C-G formula based on SCr of 0.66 mg/dL). Liver Function Tests: Recent Labs  Lab 09/18/23 1028  AST 27  ALT 15  ALKPHOS 70  BILITOT 0.8  PROT 8.5*  ALBUMIN 4.5   No results for input(s): LIPASE, AMYLASE in the last 168 hours. No results for input(s): AMMONIA in the last 168 hours. Coagulation Profile: Recent Labs  Lab 09/18/23 1028  INR 0.9   Cardiac Enzymes: No results for input(s): CKTOTAL, CKMB, CKMBINDEX, TROPONINI in the last 168 hours. BNP (last 3 results) No results for input(s): PROBNP in the last 8760 hours. HbA1C: No results for input(s): HGBA1C in the last 72 hours. CBG: Recent Labs  Lab 09/18/23 1036  GLUCAP 139*   Lipid Profile: No results for input(s): CHOL, HDL, LDLCALC, TRIG, CHOLHDL, LDLDIRECT in the last 72 hours. Thyroid  Function Tests: No results for input(s): TSH, T4TOTAL, FREET4, T3FREE, THYROIDAB in the last 72 hours. Anemia Panel: No results for input(s): VITAMINB12, FOLATE, FERRITIN, TIBC, IRON, RETICCTPCT in the last 72 hours. Urine analysis: No results found for: COLORURINE, APPEARANCEUR, LABSPEC, PHURINE, GLUCOSEU, HGBUR, BILIRUBINUR,  KETONESUR, PROTEINUR, UROBILINOGEN, NITRITE, LEUKOCYTESUR  Radiological Exams on Admission: CT ANGIO CHEST AORTA W/CM &/OR WO/CM Result Date: 09/18/2023 CLINICAL DATA:  Chest pain, left arm numbness, and dizziness EXAM: CT ANGIOGRAPHY CHEST WITH CONTRAST TECHNIQUE: Multidetector CT imaging of the chest was performed using the standard protocol during bolus administration of intravenous contrast. Multiplanar CT image reconstructions and MIPs were obtained to evaluate the vascular anatomy. RADIATION DOSE REDUCTION: This exam was performed according to the departmental dose-optimization program which includes automated exposure control, adjustment of the mA and/or kV according to patient size and/or use of iterative reconstruction technique. CONTRAST:  OMNIPAQUE IOHEXOL 350 MG/ML SOLN COMPARISON:  Chest radiograph dated 09/18/2022 FINDINGS: Cardiovascular: Preferential opacification of the thoracic aorta. No evidence of thoracic aortic aneurysm or dissection. Normal heart size. No pericardial effusion. Aortic atherosclerosis. Mediastinum/Nodes: Imaged thyroid  gland without nodules meeting criteria for imaging follow-up by size. Normal esophagus. No pathologically enlarged axillary, supraclavicular, mediastinal, or hilar lymph nodes. Lungs/Pleura: The central airways are patent. No focal consolidation. No pneumothorax. No pleural effusion. Upper abdomen: Normal. Musculoskeletal: No acute or abnormal lytic or blastic osseous lesions. Multilevel degenerative changes of the thoracic spine. Review of the MIP images confirms the above findings. IMPRESSION: 1. No evidence of thoracic aortic aneurysm or dissection. 2. No acute intrathoracic abnormality. 3.  Aortic Atherosclerosis (ICD10-I70.0). Electronically Signed   By: Limin  Xu M.D.   On: 09/18/2023 11:57   CT ANGIO HEAD NECK W WO CM (CODE STROKE) Result Date: 09/18/2023 CLINICAL DATA:  Code stroke, left-sided deficit, dizziness. EXAM: CT ANGIOGRAPHY  HEAD AND NECK WITH AND WITHOUT CONTRAST TECHNIQUE: Multidetector CT imaging of the head and neck was performed using the standard protocol during bolus administration of intravenous contrast. Multiplanar CT image reconstructions and MIPs were obtained to evaluate the vascular anatomy. Carotid stenosis measurements (when applicable) are obtained utilizing NASCET criteria, using the distal internal carotid diameter as the denominator. RADIATION DOSE REDUCTION: This exam was performed according to the departmental dose-optimization program which includes automated exposure control, adjustment of the mA and/or kV according to patient size and/or use of iterative reconstruction technique. CONTRAST:  OMNIPAQUE IOHEXOL 350 MG/ML SOLN COMPARISON:  Same day head CT. FINDINGS: CTA NECK FINDINGS Aortic arch: Standard configuration of the aortic arch. Imaged portion shows no evidence of aneurysm or dissection. Mild atherosclerosis of the aortic arch. No significant stenosis of the  major arch vessel origins. Pulmonary arteries: As permitted by contrast timing, there are no filling defects in the visualized pulmonary arteries. Subclavian arteries: Patent bilaterally. Mild atherosclerosis of the proximal left subclavian artery without significant stenosis. Right carotid system: No evidence of dissection, stenosis (50% or greater), or occlusion. Mild atherosclerosis at the carotid bifurcation without stenosis. Left carotid system: No evidence of dissection, stenosis (50% or greater), or occlusion. Mild atherosclerosis at the carotid bifurcation without stenosis. Vertebral arteries: Left vertebral artery is dominant. No evidence of dissection, stenosis (50% or greater), or occlusion. Tortuosity of the left V1 segment. Skeleton: No acute or aggressive finding noted. Other neck: The visualized airway is patent. No cervical lymphadenopathy. Multiple thyroid  nodules measuring up to 0.9 cm. Upper chest: Visualized lung apices are  clear. Review of the MIP images confirms the above findings CTA HEAD FINDINGS ANTERIOR CIRCULATION: The intracranial ICAs are patent bilaterally. Atherosclerosis of the right carotid siphon without significant stenosis. No significant stenosis, proximal occlusion, aneurysm, or vascular malformation. MCAs: The middle cerebral arteries are patent bilaterally. ACAs: The anterior cerebral arteries are patent bilaterally. POSTERIOR CIRCULATION: No significant stenosis, proximal occlusion, aneurysm, or vascular malformation. PCAs: Patent bilaterally.  Fetal origin of the right PCA. Pcomm: Visualized on the right. SCAs: The superior cerebellar arteries are patent bilaterally. Basilar artery: Patent AICAs: Patent PICAs: Patent Vertebral arteries: The intracranial vertebral arteries are patent. Venous sinuses: As permitted by contrast timing, patent. Anatomic variants: None Review of the MIP images confirms the above findings IMPRESSION: No large vessel occlusion. No high-grade stenosis, aneurysm, or dissection of the arteries in the head and neck. Mild atherosclerosis as above. Electronically Signed   By: Denny Flack M.D.   On: 09/18/2023 11:49   CT HEAD CODE STROKE WO CONTRAST Result Date: 09/18/2023 CLINICAL DATA:  Code stroke. 68 year old female. Left side deficit. Dizziness. EXAM: CT HEAD WITHOUT CONTRAST TECHNIQUE: Contiguous axial images were obtained from the base of the skull through the vertex without intravenous contrast. RADIATION DOSE REDUCTION: This exam was performed according to the departmental dose-optimization program which includes automated exposure control, adjustment of the mA and/or kV according to patient size and/or use of iterative reconstruction technique. COMPARISON:  Head CT 08/07/2020. FINDINGS: Brain: Cerebral volume is stable since 2022, normal for age. No midline shift, ventriculomegaly, mass effect, evidence of mass lesion, intracranial hemorrhage or evidence of cortically based acute  infarction. Gray-white differentiation stable and within normal limits. Vascular: No suspicious intracranial vascular hyperdensity. Mild Calcified atherosclerosis at the skull base. Skull: Stable and intact. Sinuses/Orbits: Chronic sphenoid sinusitis with mucoperiosteal thickening is stable. Tympanic cavities and mastoids appear clear. Other: No gaze deviation. Visualized scalp soft tissues are within normal limits. ASPECTS [facility] Stroke Program Early CT Score) Total score (0-10 with 10 being normal): 10 IMPRESSION: 1. Stable since 2022 and normal for age noncontrast CT appearance of the brain. ASPECTS 10. 2. These results were communicated to Dr. Doretta Gant at 10:48 am on 09/18/2023 by text page via the [facility] messaging system. Electronically Signed   By: Marlise Simpers M.D.   On: 09/18/2023 10:49    EKG: Independently reviewed.  Sinus rhythm, no acute ST changes.  Assessment/Plan Principal Problem:   Stroke [facility]) Active Problems:   Stroke (cerebrum) (HCC)  (please populate well all problems here in Problem List. (For example, if patient is on BP meds at home and you resume or decide to hold them, it is a problem that needs to be her. Same for CAD, COPD, HLD and so on)  Left-sided paresthesia -Neurology consulted, will consider patient not a candidate for TNK therapy - Stroke workup - Brain MRI - Echocardiogram - Telemonitoring x 24 hours - Continue aspirin, check A1c and lipid panel and TSH - Patient already on daily aspirin chronically, will escalate to dual antiplatelet aspirin plus Plavix regimen for 21 days then go back to aspirin alone therapy  HTN - Allow permissive hypertension - As needed hydralazine  Hyperthyroidism -Check TSH - Continue methimazole  DVT prophylaxis: SCD Code Status: Full code Family Communication: Husband and grandson at bedside Disposition Plan: Expect less than 2 midnight stay Consults called: Neurology Admission status: Telemetry observation   [place]  MD [facility] Pager [phone]  09/18/2023, 1:39 PM

## 2023-09-18 NOTE — Code Documentation (Signed)
 Stroke Response Nurse Documentation Code Documentation  Leah Luna is a 68 y.o. female arriving to [facility] via [facility] on 09/18/2023 with past medical hx of HTN. On aspirin 81 mg daily. Code stroke was activated by ED.   Patient from home where she was LKW at 0730 and now complaining of left sided numbness. Patient reports waking up normal, she went to the bathroom to brush her teeth, and had sudden onset dizziness, left sided numbness, left eye pain, chest pain. She reports it hit her suddenly, and felt like she was going to die. Endorses she is still having left shoulder numbness.   Stroke team at the bedside on patient arrival. Labs drawn and patient cleared for CT by Dr. Peggi Bowels. Patient to CT with team. NIHSS 2, see documentation for details and code stroke times. Patient with disoriented and left decreased sensation on exam. The following imaging was completed:  CT Head and CTA. Patient is not a candidate for IV Thrombolytic due to too mild to treat, per MD. Patient is not a candidate for IR due to no LVO on imaging, per MD.   Care Plan: every 2 hour NIHSS and vital signs, per MD. Swallow screen per order.  Process Delays Noted: none  Bedside handoff with ED RN Odilia Bennett.    Gareld June  Stroke Response RN

## 2023-09-18 NOTE — ED Notes (Signed)
 Called lab about the add on troponin, said that they can run it with the blood they have.

## 2023-09-18 NOTE — ED Notes (Signed)
 CT called and pt taken to CT 3

## 2023-09-18 NOTE — Progress Notes (Signed)
 Echocardiogram 2D Echocardiogram has been performed.  Leah Luna 09/18/2023, 5:25 PM

## 2023-09-18 NOTE — ED Notes (Signed)
 Called CCMD to add to monitoring

## 2023-09-18 NOTE — ED Notes (Signed)
 Secretary called and information provided to call code stroke.

## 2023-09-18 NOTE — ED Triage Notes (Addendum)
 Pt comes via [facility] with left arm numbness that started at 0730 today. Pt states some dizziness and numbness to left side of face. Pt speaking in clear sentences.   Pt got up at 0730 and was fine. Pt then went to watch her face and she got dizzy and her sight was blurry. Pt states she couldn't walk. Pt states she feels normal now.  Pt took aspirin this morning.

## 2023-09-18 NOTE — Plan of Care (Signed)
  Problem: Education: Goal: Knowledge of disease or condition will improve Outcome: Progressing   Problem: Ischemic Stroke/TIA Tissue Perfusion: Goal: Complications of ischemic stroke/TIA will be minimized Outcome: Progressing   Problem: Coping: Goal: Will verbalize positive feelings about self Outcome: Progressing   Problem: Self-Care: Goal: Ability to participate in self-care as condition permits will improve Outcome: Progressing   Problem: Nutrition: Goal: Dietary intake will improve Outcome: Progressing   Problem: Education: Goal: Knowledge of General Education information will improve Description: Including pain rating scale, medication(s)/side effects and non-pharmacologic comfort measures Outcome: Progressing

## 2023-09-18 NOTE — ED Triage Notes (Signed)
 First nurse note: Pt here via [facility] from [facility] for L arm numbness, pt states numbness to L side of face and dizziness that started at 0730 today.   138/78 HR: 80 12 lead unremarkable   CBG 142 18G R AC

## 2023-09-18 NOTE — Progress Notes (Signed)
   09/18/23 1045  Spiritual Encounters  Type of Visit Initial  Care provided to: [facility] partners present during encounter Other (comment) Training and development officer)  Reason for visit Code  OnCall Visit Yes   Chaplain responded to a Code Stroke and found husband and grandson waiting in the patient's room while she was in CT.  Husband and grandson said they didn't have any needs at this time.  Chaplain brought a chair in for the grandson who was standing.  Dietra Fraction said his grandmother had some health issues but nothing like this.  Chaplain shared that [employer] is available and if they or the patient has a need, they can ask the Nurse to page the Chaplain.    Rev. Rana M. Nolon Baxter, M.Div. Chaplain Resident [facility]

## 2023-09-18 NOTE — ED Notes (Signed)
 Unable to check sugar due to glucose locked pt taken to cT and will check there

## 2023-09-18 NOTE — ED Provider Notes (Signed)
 [facility] Provider Note    Event Date/Time   First MD Initiated Contact with Patient 09/18/23 1041     (approximate)   History   Numbness   HPI  Leah Luna is a 68 y.o. female who comes in with difficulties with ambulation with some left-sided numbness, she did report a little bit of chest pain that just hit her all of a sudden.  She felt like she was going to pass out but did not actually pass out.  Initially concern for last known normal of 730.  She denies any abdominal pain, falls in her head or other concerns.     Physical Exam   Triage Vital Signs: ED Triage Vitals  Encounter Vitals Group     BP 09/18/23 1029 (!) 158/87     Girls Systolic BP Percentile --      Girls Diastolic BP Percentile --      Boys Systolic BP Percentile --      Boys Diastolic BP Percentile --      Pulse Rate 09/18/23 1029 (!) 107     Resp 09/18/23 1029 18     Temp 09/18/23 1029 98.7 F (37.1 C)     Temp src --      SpO2 09/18/23 1029 98 %     Weight 09/18/23 1030 180 lb (81.6 kg)     Height 09/18/23 1030 5' 2 (1.575 m)     Head Circumference --      Peak Flow --      Pain Score 09/18/23 1027 0     Pain Loc --      Pain Education --      Exclude from Growth Chart --     Most recent vital signs: Vitals:   09/18/23 1029  BP: (!) 158/87  Pulse: (!) 107  Resp: 18  Temp: 98.7 F (37.1 C)  SpO2: 98%     General: Awake, no distress.  CV:  Good peripheral perfusion.  Resp:  Normal effort.  Abd:  No distention.  Other:  Equal strength in arms and legs.  Cranial nerves appear intact headache stroke scale of 2 for sensation changes   ED Results / Procedures / Treatments   Labs (all labs ordered are listed, but only abnormal results are displayed) Labs Reviewed  CBG MONITORING, ED - Abnormal; Notable for the following components:      Result Value   Glucose-Capillary 139 (*)    All other components within normal limits  PROTIME-INR   APTT  CBC  DIFFERENTIAL  COMPREHENSIVE METABOLIC PANEL WITH GFR  ETHANOL  I-STAT CREATININE, ED  TROPONIN I (HIGH SENSITIVITY)     EKG  My interpretation of EKG:  Sinus tachycardia rate of 108 without any ST elevation or T wave inversions, normal intervals  RADIOLOGY I have reviewed the ct  personally and interpreted no evidence of intercranial hemorrhage   PROCEDURES:  Critical Care performed: No  .1-3 Lead EKG Interpretation  Performed by: Lubertha Rush, MD Authorized by: Lubertha Rush, MD     Interpretation: normal     ECG rate:  90   ECG rate assessment: normal     Rhythm: sinus rhythm     Ectopy: none     Conduction: normal      MEDICATIONS ORDERED IN ED: Medications  sodium chloride flush (NS) 0.9 % injection 3 mL (has no administration in time range)     IMPRESSION / MDM / ASSESSMENT  AND PLAN / ED COURSE  I reviewed the triage vital signs and the nursing notes.   Patient's presentation is most consistent with acute presentation with potential threat to life or bodily function.   Differential includes ACS, dissection, stroke.  Stroke code called  Dr. Doretta Gant at bedside evaluating patient.  CMP reassuring glucose normal coags normal CBC normal  CT angio negative for dissection CT head and neck were negative  Discussed with neurology no indication for TNK but did recommend stroke workup.  Will discuss with hospital team for admission    The patient is on the cardiac monitor to evaluate for evidence of arrhythmia and/or significant heart rate changes.      FINAL CLINICAL IMPRESSION(S) / ED DIAGNOSES   Final diagnoses:  Tingling  Near syncope     Rx / DC Orders   ED Discharge Orders     None        Note:  This document was prepared using Dragon voice recognition software and may include unintentional dictation errors.   Lubertha Rush, MD 09/18/23 [phone]

## 2023-09-19 ENCOUNTER — Inpatient Hospital Stay
Admit: 2023-09-19 | Discharge: 2023-09-19 | Disposition: A | Discharge status: Home / Self Care | Attending: Physician Assistant | Admitting: Physician Assistant

## 2023-09-19 ENCOUNTER — Observation Stay

## 2023-09-19 DIAGNOSIS — G459 Transient cerebral ischemic attack, unspecified: Secondary | ICD-10-CM

## 2023-09-19 DIAGNOSIS — I639 Cerebral infarction, unspecified: Secondary | ICD-10-CM | POA: Diagnosis not present

## 2023-09-19 LAB — ECHOCARDIOGRAM COMPLETE
AR max vel: 1.96 cm2
AV Area VTI: 1.75 cm2
AV Area mean vel: 1.84 cm2
AV Mean grad: 4.4 mmHg
AV Peak grad: 8.1 mmHg
Ao pk vel: 1.42 m/s
Area-P 1/2: 4.39 cm2
Height: 62 in
S' Lateral: 2.5 cm
Weight: 2880 [oz_av]

## 2023-09-19 LAB — HIV ANTIBODY (ROUTINE TESTING W REFLEX): HIV Screen 4th Generation wRfx: NONREACTIVE

## 2023-09-19 LAB — LIPID PANEL
Cholesterol: 198 mg/dL (ref 0–200)
HDL: 43 mg/dL (ref >40)
LDL Cholesterol: 130 mg/dL — ABNORMAL HIGH (ref 0–99)
Total CHOL/HDL Ratio: 4.6 ratio
Triglycerides: 124 mg/dL (ref <150)
VLDL: 25 mg/dL (ref 0–40)

## 2023-09-19 MED ORDER — CLOPIDOGREL BISULFATE 75 MG PO TABS: 75.0000 mg | ORAL_TABLET | Freq: Every day | ORAL | 2 refills | Status: AC

## 2023-09-19 MED ORDER — ATORVASTATIN CALCIUM 40 MG PO TABS: 40.0000 mg | ORAL_TABLET | Freq: Every day | ORAL | 0 refills | Status: AC

## 2023-09-19 MED ORDER — ASPIRIN 81 MG PO CHEW: 81.0000 mg | CHEWABLE_TABLET | Freq: Every day | ORAL | 0 refills | Status: AC

## 2023-09-19 MED ORDER — ATORVASTATIN CALCIUM 20 MG PO TABS: 40.0000 mg | ORAL_TABLET | Freq: Every day | ORAL | Status: DC

## 2023-09-19 NOTE — Consult Note (Signed)
 NEUROLOGY CONSULT NOTE   Date of service: 09/18/23 Patient Name: Leah Luna MRN:  [id number] DOB:  03/04/1956 Chief Complaint: stroke code Requesting Provider: Mike Alcon MD  History of Present Illness  Leah Luna is a 68 y.o. female with hx of HTN, hyperthyroidism on methimazole, anxiety depression who presents with residual L sided numbness after 2 episodes of dizziness and chest pain earlier today. History and exam performed with assistance of Spanish interpreter. LKW 0730 when she was washing her mouth out in the bathroom. She then felt sharp chest pain like I was going to die and felt dizzy though not room spinning. She says she was unable to move for 15 min but is not sure why, states she was not weak. Called her husband for assistance and was able to ambulate back to bed. She had a similar spell 30 min later that also resolved. She feels back to baseline with no chest pain or dizziness although she does have residual L sided numbness in face and arm. NIHSS = 2 for questions and sensory. CT head showed no acute process on personal review. TNK not administered 2/2 too mild to treat. CTA H&N showed no LVO.   LKW: 0730 Modified rankin score: 2-Slight disability-UNABLE to perform all activities but does not need assistance IV Thrombolysis: no, too mild to treat  NIHSS = 2 for questions and sensory   ROS  Comprehensive ROS performed and pertinent positives documented in HPI   Past History   Past Medical History:  Diagnosis Date   Hypertension     History reviewed. No pertinent surgical history.  Family History: Family History  Problem Relation Age of Onset   Breast cancer Neg Hx     Social History  reports that she has never smoked. She has never been exposed to tobacco smoke. She has never used smokeless tobacco. She reports that she does not drink alcohol and does not use drugs.  Allergies  Allergen Reactions   Amoxicillin Other (See Comments)  and Rash   Oyster Shell Calcium-D Nausea Only    Other reaction(s): vomiting, pain in hands/feet; itching  Other reaction(s): vomiting, pain in hands/feet   Egg-Derived Products Other (See Comments)    Headache   Omeprazole Diarrhea   Pork-Derived Products Other (See Comments)    Migraines     Medications   Current Facility-Administered Medications:     stroke: early stages of recovery book, , Does not apply, Once, Antoniette Batty T, MD   acetaminophen (TYLENOL) tablet 650 mg, 650 mg, Oral, Q8H PRN, Antoniette Batty T, MD, 650 mg at 09/18/23 2128   aspirin chewable tablet 81 mg, 81 mg, Oral, Daily, Zhang, Ping T, MD   busPIRone (BUSPAR) tablet 7.5 mg, 7.5 mg, Oral, BID, Zhang, Ping T, MD, 7.5 mg at 09/18/23 2128   clopidogrel (PLAVIX) tablet 75 mg, 75 mg, Oral, Daily, Antoniette Batty T, MD, 75 mg at 09/18/23 1606   diclofenac Sodium (VOLTAREN) 1 % topical gel 2 g, 2 g, Topical, QID, Antoniette Batty T, MD, 2 g at 09/18/23 2132   fluticasone (FLONASE) 50 MCG/ACT nasal spray 1 spray, 1 spray, Each Nare, Daily, Antoniette Batty T, MD   gabapentin (NEURONTIN) capsule 100 mg, 100 mg, Oral, QHS, Zhang, Ping T, MD, 100 mg at 09/18/23 2128   hydrALAZINE (APRESOLINE) injection 5 mg, 5 mg, Intravenous, Q6H PRN, Zhang, Ping T, MD   methimazole (TAPAZOLE) tablet 5 mg, 5 mg, Oral, Daily, Zhang, Brooke [place], MD, 5 mg  at 2023-09-30 1606   olopatadine (PATANOL) 0.1 % ophthalmic solution 1 drop, 1 drop, Both Eyes, BID, Antoniette Batty T, MD, 1 drop at 2023-09-30 2132   pantoprazole (PROTONIX) EC tablet 40 mg, 40 mg, Oral, Daily, Antoniette Batty T, MD, 40 mg at 2023/09/30 1605   senna-docusate (Senokot-S) tablet 2 tablet, 2 tablet, Oral, QHS, [place], MD, 2 tablet at 09-30-23 2128   sertraline (ZOLOFT) tablet 100 mg, 100 mg, Oral, Daily, Antoniette Batty T, MD, 100 mg at 2023/09/30 1605   sodium chloride flush (NS) 0.9 % injection 3-10 mL, 3-10 mL, Intravenous, Q12H, Antoniette Batty T, MD, 10 mL at September 30, 2023 2130   sodium chloride flush (NS) 0.9 %  injection 3-10 mL, 3-10 mL, Intravenous, PRN, Antoniette Batty T, MD  Vitals   Vitals:   30-Sep-2023 1544 2023-09-30 1937 30-Sep-2023 2343 09/19/23 0403  BP: 119/71 124/67 127/66 (!) 140/72  Pulse: 76 83 79 70  Resp: 16     Temp: 98.1 F (36.7 C) 98.3 F (36.8 C) 98.3 F (36.8 C) 98.2 F (36.8 C)  SpO2: 97% 97% 97% 95%  Weight:      Height:        Body mass index is 32.92 kg/m.   Physical Exam   Gen: patient lying in bed, NAD CV: extremities appear well-perfused Resp: normal WOB  Neurologic exam MS: alert, oriented to self, hospital, month, and hx but not age, follows commands Speech: no dysarthria, no aphasia CN: PERRL, VFF, EOMI, numbness to L face, face symmetric, hearing intact to voice Motor: 5/5 strength throughout Sensory: numbness LUE Reflexes: 2+ symm with toes down bilat Coordination: FNF intact bilat Gait: deferred  Labs/Imaging/Neurodiagnostic studies   CBC:  Recent Labs  Lab 30-Sep-2023 1028  WBC 4.4  NEUTROABS 2.7  HGB 13.7  HCT 40.5  MCV 85.3  PLT 236   Basic Metabolic Panel:  Lab Results  Component Value Date   NA 140 September 30, 2023   K 4.2 September 30, 2023   CO2 27 Sep 30, 2023   GLUCOSE 154 (H) 09/30/2023   BUN 19 09-30-23   CREATININE 0.66 09-30-2023   CALCIUM 9.4 09-30-23   GFRNONAA >60 2023-09-30   Lipid Panel: No results found for: LDLCALC HgbA1c:  Lab Results  Component Value Date   HGBA1C 6.4 (H) September 30, 2023   Urine Drug Screen: No results found for: LABOPIA, COCAINSCRNUR, LABBENZ, AMPHETMU, THCU, LABBARB  Alcohol Level     Component Value Date/Time   ETH <15 Sep 30, 2023 1028   INR  Lab Results  Component Value Date   INR 0.9 09/30/23   APTT  Lab Results  Component Value Date   APTT 29 30-Sep-2023   AED levels: No results found for: PHENYTOIN, ZONISAMIDE, LAMOTRIGINE, LEVETIRACETA  CT Head without contrast(Personally reviewed): No acute process, ASPECTS 10  CT angio Head and Neck with contrast(Personally  reviewed): No LVO or high-grade stenosis   CTA chest negative for thoracic aortic dissection   ASSESSMENT   Leah Luna is a 68 y.o. female with hx of HTN, hyperthyroidism on methimazole, anxiety depression who presents with residual L sided numbness after 2 episodes of dizziness and chest pain earlier today. Numbness is concerning for TIA vs stroke. While resolution of chest pain is reassuring I am concerned that she had 2 discrete episodes of chest pain and dizziness today. CTA chest neg for dissection.  RECOMMENDATIONS   - Admit for stroke/TIA workup - Additional cardiac workup per ED and admitting team - Permissive HTN x48 hrs from sx  onset or until stroke ruled out by MRI goal BP <220/120. PRN labetalol or hydralazine if BP above these parameters. Avoid oral antihypertensives. - MRI brain wo contrast - TTE  - Check A1c and LDL + add statin per guidelines - ASA 81mg  daily + plavix 75mg  daily x21 days f/b plavix 75mg  daily monotherapy after that - q4 hr neuro checks - STAT head CT for any change in neuro exam - Tele - PT/OT/SLP - Stroke education - Amb referral to neurology upon discharge   Will continue to follow  ______________________________________________________________________    Signed, Eleni Griffin, MD [facility]

## 2023-09-19 NOTE — Plan of Care (Signed)
 Neurology plan of care  Please see neurology consult note from yesterday.  MRI brain showed no e/o acute ischemia  TTE 1. Left ventricular ejection fraction, by estimation, is 55 to 60% . The left ventricle has normal function. Left ventricular endocardial border not optimally defined to evaluate regional wall motion. There is mild left ventricular hypertrophy. Left ventricular diastolic parameters were normal. 2. Right ventricular systolic function is normal. The right ventricular size is normal. Tricuspid regurgitation signal is inadequate for assessing PA pressure. 3. The mitral valve is normal in structure. No evidence of mitral valve regurgitation. No evidence of mitral stenosis. 4. The aortic valve is tricuspid. Aortic valve regurgitation is not visualized. No aortic stenosis is present. 5. The inferior vena cava is normal in size with greater than 50% respiratory variability, suggesting right atrial pressure of 3 mmHg.  Stroke Labs     Component Value Date/Time   CHOL 198 09/19/2023 0518   TRIG 124 09/19/2023 0518   HDL 43 09/19/2023 0518   CHOLHDL 4.6 09/19/2023 0518   VLDL 25 09/19/2023 0518   LDLCALC 130 (H) 09/19/2023 0518    Lab Results  Component Value Date/Time   HGBA1C 6.4 (H) 09/18/2023 02:46 PM    Etiology of L sided numbness on presentation favored to be secondary to TIA.  I am however concerned that she may also have an underlying cardiac issue that precipitated this chest pain and dizziness twice yesterday morning. D/w Dr. Marquette Sites who will arrange ambulatory cardiac monitoring.  Final recommendations: - ASA 81mg  daily + plavix 75mg  daily x21 days f/b plavix 75mg  daily monotherapy after that  - Add atorvastatin 40mg  daily - Agree with ambulatory cardiac monitoring - Patient may f/u with her PCP and cardiology  Neurology will be available prn for any further questions  Greg Leaks, MD [facility] [phone]  If 7pm- 7am, please page neurology on  call as listed in [facility].

## 2023-09-19 NOTE — Progress Notes (Signed)
 Occupational Therapy Evaluation Patient Details Name: Leah Luna MRN: [id number] DOB: 1955/10/31 Today's Date: 09/19/2023   History of Present Illness   Pt is a 68 y.o. female with medical history significant of HTN, GERD, hyperthyroidism on methimazole, anxiety/depression, multiple OA's, presented with strokelike symptoms.  MD assessment includes: Left-sided paresthesia with MRI impression stating no evidence of acute intracranial abnormality.     Clinical Impressions Ms Luna was seen for OT evaluation this date. Prior to hospital admission, pt was IND in ADLs. Pt lives with spouse. Pt presents to acute OT demonstrating impaired ADL performance and functional mobility 2/2 generalized weakness (See OT problem list for additional functional deficits). In-person interpreter Mylinda Asa utilized throughout session.  Pt currently requires MOD I for LB dressing. Pt reported numbness and tingling in LUE, MMT 4+/5 for grip strength and elbow flexion and extension, sensation intact. Pt completed fine motor task with increased time. All education complete, no needs identified, will sign-off. Pt is currently receiving services from the [facility] program, recommend continuation upon hospital d/c.     If plan is discharge home, recommend the following:   Assistance with cooking/housework;A little help with bathing/dressing/bathroom     Functional Status Assessment   Patient has had a recent decline in their functional status and demonstrates the ability to make significant improvements in function in a reasonable and predictable amount of time.     Equipment Recommendations   None recommended by OT     Recommendations for Other Services         Precautions/Restrictions   Precautions Precautions: Fall Recall of Precautions/Restrictions: Intact Restrictions Weight Bearing Restrictions Per Provider Order: No     Mobility Bed Mobility Overal bed mobility: Modified  Independent                  Transfers                   General transfer comment: Not tested      Balance Overall balance assessment: Needs assistance Sitting-balance support: No upper extremity supported, Feet supported Sitting balance-Leahy Scale: Good                                     ADL either performed or assessed with clinical judgement   ADL Overall ADL's : Modified independent                                             Vision         Perception         Praxis         Pertinent Vitals/Pain Pain Assessment Pain Assessment: No/denies pain     Extremity/Trunk Assessment Upper Extremity Assessment Upper Extremity Assessment: Right hand dominant;Generalized weakness;LUE deficits/detail LUE Deficits / Details: MMT 4+/5 for grip strength, elbow flexion and extension LUE Sensation: Pt reports mild numbness in hand LUE Coordination: decreased speed   Lower Extremity Assessment Lower Extremity Assessment: RLE deficits/detail;LLE deficits/detail RLE Deficits / Details: Strength WFL RLE Sensation: WNL RLE Coordination: WNL LLE Deficits / Details: Knee ext 4/5, knee flex 3+/5, hip flex 3+/5, DF 3+/5 LLE Sensation: decreased light touch LLE Coordination: WNL       Communication Communication Communication: No apparent difficulties (interpreter utilized)   Cognition Arousal: Alert Behavior During  Therapy: WFL for tasks assessed/performed Cognition: No apparent impairments                               Following commands: Intact       Cueing  General Comments   Cueing Techniques: Verbal cues;Tactile cues      Exercises     Shoulder Instructions      Home Living Family/patient expects to be discharged to:: Private residence Living Arrangements: Spouse/significant other;Other relatives Available Help at Discharge: Family;Available 24 hours/day Type of Home: Mobile home Home  Access: Stairs to enter Entrance Stairs-Number of Steps: 4-5 Entrance Stairs-Rails: Right;Left;Can reach both Home Layout: One level         Firefighter: Standard     Home Equipment: Grab bars - tub/shower;Shower seat          Prior Functioning/Environment Prior Level of Function : Independent/Modified Independent;History of Falls (last six months)             Mobility Comments: Ind amb community distances without an AD, one fall in the last 6 months slipping in the shower ADLs Comments: Ind with ADLs    OT Problem List: Decreased strength   OT Treatment/Interventions:        OT Goals(Current goals can be found in the care plan section)   Acute Rehab OT Goals Patient Stated Goal: to go home OT Goal Formulation: With patient Time For Goal Achievement: 09/19/23 Potential to Achieve Goals: Good   OT Frequency:       Co-evaluation              AM-PAC OT 6 Clicks Daily Activity     Outcome Measure Help from another person eating meals?: None Help from another person taking care of personal grooming?: None Help from another person toileting, which includes using toliet, bedpan, or urinal?: None Help from another person bathing (including washing, rinsing, drying)?: A Little Help from another person to put on and taking off regular upper body clothing?: None Help from another person to put on and taking off regular lower body clothing?: None 6 Click Score: 23   End of Session    Activity Tolerance: Patient tolerated treatment well Patient left: in bed;with call bell/phone within reach;with bed alarm set;with family/visitor present  OT Visit Diagnosis: Muscle weakness (generalized) (M62.81)                Time: 1610-9604 OT Time Calculation (min): 17 min Charges:  OT General Charges $OT Visit: 1 Visit OT Evaluation $OT Eval Low Complexity: 1 Low  Chauncey Bruno Jakie Mayhew, Student OT   [employer] 09/19/2023, 1:50 PM

## 2023-09-19 NOTE — TOC Transition Note (Signed)
 Transition of Care [facility]) - Discharge Note   Patient Details  Name: Leah Luna MRN: [id number] Date of Birth: Aug 04, 1955  [facility] CM/SW Contact:  Crayton Docker, RN 09/19/2023, 5:10 PM   Clinical Narrative:     Discharge orders noted. Patient has [facility] coverage. CM call to [facility], phone: [phone] regarding pending discharge. CM spoke Dr. Merriam Abbey. Per Dr. Dorrene Gaucher, please confirm patient took aspirin and Plavix today and patient will get Aspirin and Plavix tomorrow. Per Dr. Dorrene Gaucher, please inform patient to come to [facility] clinic on tomorrow and to bring all medications. CM to patient's room regarding Dr. Dorrene Gaucher instructions. Patient verbalized understanding and agreement. Patient states grandson will provide transportation.   Patient Goals and CMS Choice    Home/self care   Discharge Placement      Home/self care         Discharge Plan and Services Additional resources added to the After Visit Summary for     Social Drivers of Health (SDOH) Interventions SDOH Screenings   Food Insecurity: No Food Insecurity (09/18/2023)  Housing: Low Risk  (09/18/2023)  Transportation Needs: No Transportation Needs (09/18/2023)  Utilities: Not At Risk (09/18/2023)  Social Connections: Unknown (09/18/2023)  Tobacco Use: Low Risk  (09/18/2023)     Readmission Risk Interventions     No data to display

## 2023-09-19 NOTE — Therapy (Signed)
 Zio heart monitor placed using video interpreter.  No issues.  Pt verbalized understanding.

## 2023-09-19 NOTE — Discharge Summary (Signed)
 Physician Discharge Summary   Patient: Leah Luna MRN: [id number] DOB: 11-23-1955  Admit date:     09/18/2023  Discharge date: 09/19/23  Discharge Physician: Roise Cleaver   PCP: Larina Plough, Luna   Recommendations at discharge:   Follow-up with cardiology to review Zio patch results Follow-up with primary care for chronic medication management.  Discharge Diagnoses: Principal Problem:   Stroke [facility]) Active Problems:   Stroke (cerebrum) (HCC)  Resolved Problems:   * No resolved hospital problems. *  Hospital Course: Leah Luna is a 68 year old female with hypertension, GERD, hyperthyroidism on methimazole, anxiety, depression, who presented with sudden dizziness, left-sided numbness in the left arm and left hand.  On arrival to the ED she was mildly tachycardic, blood pressure 150/80.  CT head negative for acute findings.  CTA head neck negative for LVO.  She was admitted for stroke workup.  MRI was ultimately negative for acute CVA.  She received stroke workup which revealed elevated LDL and was started on statin.  Neurology was consulted and has recommended aspirin and Plavix for 21 days followed by Plavix monotherapy.  Echocardiogram was unrevealing, mild left ventricular hypertrophy, EF 55 to 60%, tricuspid regurg.  There is some concern that this may have been a cardiac event that precipitated her dizziness and thus we requested long-term monitoring.  Zio patch was arranged prior to discharge.  Patient will follow-up closely with cardiology for continued monitoring.  *Spanish interpreter used for exam and evaluation.  TIA - Presumed TIA - Head CT, brain MRI without acute intracranial abnormalities, CTA with mild atherosclerosis,  -Symptoms resolved now - A1c 6.4% - LDL above goal 130, have initiated atorvastatin - Neurology consulted - Recommends DAPT x 21 days followed by Plavix monotherapy - Seen by PT/OT/SLP, no skilled therapy needs. -  Concern of underlying cardiac event that may have precipitated this.  Consulted cardiology who has arranged Zio patch.  Follow-up outpatient for Zio patch review - Echocardiogram: LVEF 55 to 60%, mild left ventricular hypertrophy, tricuspid regurg.  Anxiety Depression - Continue home meds  Hyperthyroidism - Continue methimazole.  TSH at goal  Hypertension - Resume home meds  BMI 32 - Outpatient follow up for lifestyle modification and risk factor management    Consultants: Neurology Procedures performed: n/a  Disposition: Home Diet recommendation:  Cardiac diet  Discharge Instructions     Call Luna for:  difficulty breathing, headache or visual disturbances   Complete by: As directed    Call Luna for:  persistant dizziness or light-headedness   Complete by: As directed    Call Luna for:  persistant nausea and vomiting   Complete by: As directed    Call Luna for:  severe uncontrolled pain   Complete by: As directed    Call Luna for:  temperature >100.4   Complete by: As directed    Diet general   Complete by: As directed    Discharge instructions   Complete by: As directed    Follow up closely with your primary care physician to discuss your new medications and stroke follow-up. You may need further medication titration. Follow-up with cardiology to discuss cardiac monitor findings.  If you begin experiencing dizziness, word finding difficulty, slurred speech, numbness, tingling, or loss of motor function, call 911 and immediately report to the ER   Increase activity slowly   Complete by: As directed         DISCHARGE MEDICATION: Allergies as of 09/19/2023  Reactions   Amoxicillin Other (See Comments), Rash   Oyster Shell Calcium-d Nausea Only   Other reaction(s): vomiting, pain in hands/feet; itching Other reaction(s): vomiting, pain in hands/feet   Egg-derived Products Other (See Comments)   Headache   Omeprazole Diarrhea   Pork-derived Products Other (See  Comments)   Migraines        Medication List     STOP taking these medications    Aspercreme Lidocaine  4 % Crea Generic drug: Lidocaine  HCl   clonazePAM 1 MG tablet Commonly known as: KLONOPIN   lidocaine  5 % Commonly known as: LIDODERM    Tamiflu 75 MG capsule Generic drug: oseltamivir   traMADol 50 MG tablet Commonly known as: ULTRAM       TAKE these medications    acetaminophen 650 MG CR tablet Commonly known as: TYLENOL Take 650 mg by mouth every 8 (eight) hours as needed for pain.   amLODipine 5 MG tablet Commonly known as: NORVASC Take 5 mg by mouth daily.   aspirin 81 MG chewable tablet Chew 1 tablet (81 mg total) by mouth daily for 21 days.   atorvastatin 40 MG tablet Commonly known as: LIPITOR Take 1 tablet (40 mg total) by mouth daily.   busPIRone 7.5 MG tablet Commonly known as: BUSPAR Take 7.5 mg by mouth 2 (two) times daily.   Calcium Antacid 500 MG chewable tablet Generic drug: calcium carbonate Chew by mouth.   clopidogrel 75 MG tablet Commonly known as: PLAVIX Take 1 tablet (75 mg total) by mouth daily. Start taking on: September 20, 2023   diclofenac Sodium 1 % Gel Commonly known as: VOLTAREN Apply topically 4 (four) times daily.   ergocalciferol 1.25 MG (50000 UT) capsule Commonly known as: VITAMIN D2 Take 50,000 Units by mouth once a week.   fluticasone 50 MCG/ACT nasal spray Commonly known as: FLONASE Place into both nostrils daily.   gabapentin 100 MG capsule Commonly known as: NEURONTIN Take 100 mg by mouth at bedtime.   hydrochlorothiazide 12.5 MG tablet Commonly known as: HYDRODIURIL Take 12.5 mg by mouth daily.   ibuprofen  800 MG tablet Commonly known as: ADVIL  Take 1 tablet (800 mg total) by mouth every 8 (eight) hours as needed.   lidocaine  2 % solution Commonly known as: XYLOCAINE  Use as directed 15 mLs in the mouth or throat every 4 (four) hours as needed for mouth pain.   Mag-G 500 (27 Mg) MG Tabs  tablet Generic drug: magnesium gluconate Take 500 mg by mouth daily.   methimazole 5 MG tablet Commonly known as: TAPAZOLE Take 5 mg by mouth daily.   omeprazole 40 MG capsule Commonly known as: PRILOSEC Take 40 mg by mouth daily.   Pataday 0.1 % ophthalmic solution Generic drug: olopatadine Place 1 drop into both eyes 2 (two) times daily.   Senna Plus 8.6-50 MG tablet Generic drug: senna-docusate Take 2 tablets by mouth at bedtime.   sertraline 100 MG tablet Commonly known as: ZOLOFT Take 100 mg by mouth daily.        Follow-up Information     Burns, Reyna Cava, Luna Follow up.   Specialty: Internal Medicine Why: Hospital follow up Contact information: [address] [place] [address] [phone]                Discharge Exam: Leah Luna Weights   09/18/23 1030  Weight: 81.6 kg   Constitutional:  Normal appearance. Non toxic-appearing.  HENT: Head Normocephalic and atraumatic.  Mucous membranes are moist.  Eyes:  Extraocular intact. Conjunctivae normal. Pupils are equal, round, and reactive to light.  Cardiovascular: Rate and Rhythm: Normal rate and regular rhythm.  Pulmonary: Non labored, symmetric rise of chest wall.  Musculoskeletal:  Normal range of motion.  Skin: warm and dry. not jaundiced.  Neurological: No focal deficit present. alert. Oriented. Psychiatric: Mood and Affect congruent.   Condition at discharge: stable  The results of significant diagnostics from this hospitalization (including imaging, microbiology, ancillary and laboratory) are listed below for reference.   Imaging Studies: ECHOCARDIOGRAM COMPLETE Result Date: 09/19/2023    ECHOCARDIOGRAM REPORT   Patient Name:   Leah Luna Date of Exam: 09/18/2023 Medical Rec #:  [id number]                 Height:       62.0 in Accession #:    [id number]                Weight:       180.0 lb Date of Birth:  13-May-1955                 BSA:          1.828 m Patient Age:     68 years                  BP:           119/71 mmHg Patient Gender: F                         HR:           70 bpm. Exam Location:  [facility] Procedure: 2D Echo, Cardiac Doppler and Color Doppler (Both Spectral and Color            Flow Doppler were utilized during procedure). Indications:     Stroke I63.9  History:         Patient has no prior history of Echocardiogram examinations.                  Stroke.  Sonographer:     Terrilee Few RCS Referring Phys:  [id number] [place] Diagnosing Phys: Sammy Crisp Luna IMPRESSIONS  1. Left ventricular ejection fraction, by estimation, is 55 to 60%. The left ventricle has normal function. Left ventricular endocardial border not optimally defined to evaluate regional wall motion. There is mild left ventricular hypertrophy. Left ventricular diastolic parameters were normal.  2. Right ventricular systolic function is normal. The right ventricular size is normal. Tricuspid regurgitation signal is inadequate for assessing PA pressure.  3. The mitral valve is normal in structure. No evidence of mitral valve regurgitation. No evidence of mitral stenosis.  4. The aortic valve is tricuspid. Aortic valve regurgitation is not visualized. No aortic stenosis is present.  5. The inferior vena cava is normal in size with greater than 50% respiratory variability, suggesting right atrial pressure of 3 mmHg. FINDINGS  Left Ventricle: Left ventricular ejection fraction, by estimation, is 55 to 60%. The left ventricle has normal function. Left ventricular endocardial border not optimally defined to evaluate regional wall motion. The left ventricular internal cavity size was normal in size. There is mild left ventricular hypertrophy. Left ventricular diastolic parameters were normal. Right Ventricle: The right ventricular size is normal. No increase in right ventricular wall thickness. Right ventricular systolic function is normal. Tricuspid regurgitation signal is inadequate for assessing PA  pressure. Left Atrium: Left atrial size was  normal in size. Right Atrium: Right atrial size was normal in size. Pericardium: There is no evidence of pericardial effusion. Presence of epicardial fat layer. Mitral Valve: The mitral valve is normal in structure. No evidence of mitral valve regurgitation. No evidence of mitral valve stenosis. Tricuspid Valve: The tricuspid valve is normal in structure. Tricuspid valve regurgitation is trivial. Aortic Valve: The aortic valve is tricuspid. Aortic valve regurgitation is not visualized. No aortic stenosis is present. Aortic valve mean gradient measures 4.4 mmHg. Aortic valve peak gradient measures 8.1 mmHg. Aortic valve area, by VTI measures 1.75 cm. Pulmonic Valve: The pulmonic valve was normal in structure. Pulmonic valve regurgitation is not visualized. No evidence of pulmonic stenosis. Aorta: The aortic root and ascending aorta are structurally normal, with no evidence of dilitation. Pulmonary Artery: The pulmonary artery is of normal size. Venous: The inferior vena cava is normal in size with greater than 50% respiratory variability, suggesting right atrial pressure of 3 mmHg. IAS/Shunts: The interatrial septum was not well visualized.  LEFT VENTRICLE PLAX 2D LVIDd:         3.60 cm   Diastology LVIDs:         2.50 cm   LV e' medial:    9.25 cm/s LV PW:         1.10 cm   LV E/e' medial:  8.1 LV IVS:        1.10 cm   LV e' lateral:   11.00 cm/s LVOT diam:     1.90 cm   LV E/e' lateral: 6.8 LV SV:         50 LV SV Index:   27 LVOT Area:     2.84 cm  RIGHT VENTRICLE             IVC RV S prime:     12.90 cm/s  IVC diam: 1.60 cm TAPSE (M-mode): 2.1 cm LEFT ATRIUM           Index        RIGHT ATRIUM          Index LA diam:      2.70 cm 1.48 cm/m   RA Area:     9.71 cm LA Vol (A2C): 28.5 ml 15.59 ml/m  RA Volume:   19.30 ml 10.56 ml/m LA Vol (A4C): 17.1 ml 9.35 ml/m  AORTIC VALVE AV Area (Vmax):    1.96 cm AV Area (Vmean):   1.84 cm AV Area (VTI):     1.75 cm AV  Vmax:           142.12 cm/s AV Vmean:          98.218 cm/s AV VTI:            0.284 m AV Peak Grad:      8.1 mmHg AV Mean Grad:      4.4 mmHg LVOT Vmax:         98.00 cm/s LVOT Vmean:        63.900 cm/s LVOT VTI:          0.175 m LVOT/AV VTI ratio: 0.62  AORTA Ao Root diam: 2.90 cm Ao Asc diam:  3.30 cm MITRAL VALVE MV Area (PHT): 4.39 cm    SHUNTS MV Decel Time: 173 msec    Systemic VTI:  0.18 m MV E velocity: 74.90 cm/s  Systemic Diam: 1.90 cm MV A velocity: 93.80 cm/s MV E/A ratio:  0.80 Leah Luna Electronically signed by Sammy Crisp Luna Signature Date/Time:  09/19/2023/12:35:27 PM    Final    MR BRAIN WO CONTRAST Result Date: 09/19/2023 CLINICAL DATA:  Neuro deficit, acute, stroke suspected EXAM: MRI HEAD WITHOUT CONTRAST TECHNIQUE: Multiplanar, multiecho pulse sequences of the brain and surrounding structures were obtained without intravenous contrast. COMPARISON:  CTA head/neck September 18, 2023. FINDINGS: Brain: No acute infarction, hemorrhage, hydrocephalus, extra-axial collection or mass lesion. Vascular: Major arterial flow voids are maintained at the skull base. Skull and upper cervical spine: Normal marrow signal. Sinuses/Orbits: Negative. Other: None. IMPRESSION: No evidence of acute intracranial abnormality. Electronically Signed   By: Stevenson Elbe M.D.   On: 09/19/2023 03:59   CT ANGIO CHEST AORTA W/CM &/OR WO/CM Result Date: 09/18/2023 CLINICAL DATA:  Chest pain, left arm numbness, and dizziness EXAM: CT ANGIOGRAPHY CHEST WITH CONTRAST TECHNIQUE: Multidetector CT imaging of the chest was performed using the standard protocol during bolus administration of intravenous contrast. Multiplanar CT image reconstructions and MIPs were obtained to evaluate the vascular anatomy. RADIATION DOSE REDUCTION: This exam was performed according to the departmental dose-optimization program which includes automated exposure control, adjustment of the mA and/or kV according to patient size and/or use  of iterative reconstruction technique. CONTRAST:  OMNIPAQUE IOHEXOL 350 MG/ML SOLN COMPARISON:  Chest radiograph dated 09/18/2022 FINDINGS: Cardiovascular: Preferential opacification of the thoracic aorta. No evidence of thoracic aortic aneurysm or dissection. Normal heart size. No pericardial effusion. Aortic atherosclerosis. Mediastinum/Nodes: Imaged thyroid  gland without nodules meeting criteria for imaging follow-up by size. Normal esophagus. No pathologically enlarged axillary, supraclavicular, mediastinal, or hilar lymph nodes. Lungs/Pleura: The central airways are patent. No focal consolidation. No pneumothorax. No pleural effusion. Upper abdomen: Normal. Musculoskeletal: No acute or abnormal lytic or blastic osseous lesions. Multilevel degenerative changes of the thoracic spine. Review of the MIP images confirms the above findings. IMPRESSION: 1. No evidence of thoracic aortic aneurysm or dissection. 2. No acute intrathoracic abnormality. 3.  Aortic Atherosclerosis (ICD10-I70.0). Electronically Signed   By: Limin  Xu M.D.   On: 09/18/2023 11:57   CT ANGIO HEAD NECK W WO CM (CODE STROKE) Result Date: 09/18/2023 CLINICAL DATA:  Code stroke, left-sided deficit, dizziness. EXAM: CT ANGIOGRAPHY HEAD AND NECK WITH AND WITHOUT CONTRAST TECHNIQUE: Multidetector CT imaging of the head and neck was performed using the standard protocol during bolus administration of intravenous contrast. Multiplanar CT image reconstructions and MIPs were obtained to evaluate the vascular anatomy. Carotid stenosis measurements (when applicable) are obtained utilizing NASCET criteria, using the distal internal carotid diameter as the denominator. RADIATION DOSE REDUCTION: This exam was performed according to the departmental dose-optimization program which includes automated exposure control, adjustment of the mA and/or kV according to patient size and/or use of iterative reconstruction technique. CONTRAST:  OMNIPAQUE  IOHEXOL 350 MG/ML SOLN COMPARISON:  Same day head CT. FINDINGS: CTA NECK FINDINGS Aortic arch: Standard configuration of the aortic arch. Imaged portion shows no evidence of aneurysm or dissection. Mild atherosclerosis of the aortic arch. No significant stenosis of the major arch vessel origins. Pulmonary arteries: As permitted by contrast timing, there are no filling defects in the visualized pulmonary arteries. Subclavian arteries: Patent bilaterally. Mild atherosclerosis of the proximal left subclavian artery without significant stenosis. Right carotid system: No evidence of dissection, stenosis (50% or greater), or occlusion. Mild atherosclerosis at the carotid bifurcation without stenosis. Left carotid system: No evidence of dissection, stenosis (50% or greater), or occlusion. Mild atherosclerosis at the carotid bifurcation without stenosis. Vertebral arteries: Left vertebral artery is dominant. No evidence of dissection, stenosis (50%  or greater), or occlusion. Tortuosity of the left V1 segment. Skeleton: No acute or aggressive finding noted. Other neck: The visualized airway is patent. No cervical lymphadenopathy. Multiple thyroid  nodules measuring up to 0.9 cm. Upper chest: Visualized lung apices are clear. Review of the MIP images confirms the above findings CTA HEAD FINDINGS ANTERIOR CIRCULATION: The intracranial ICAs are patent bilaterally. Atherosclerosis of the right carotid siphon without significant stenosis. No significant stenosis, proximal occlusion, aneurysm, or vascular malformation. MCAs: The middle cerebral arteries are patent bilaterally. ACAs: The anterior cerebral arteries are patent bilaterally. POSTERIOR CIRCULATION: No significant stenosis, proximal occlusion, aneurysm, or vascular malformation. PCAs: Patent bilaterally.  Fetal origin of the right PCA. Pcomm: Visualized on the right. SCAs: The superior cerebellar arteries are patent bilaterally. Basilar artery: Patent AICAs: Patent PICAs:  Patent Vertebral arteries: The intracranial vertebral arteries are patent. Venous sinuses: As permitted by contrast timing, patent. Anatomic variants: None Review of the MIP images confirms the above findings IMPRESSION: No large vessel occlusion. No high-grade stenosis, aneurysm, or dissection of the arteries in the head and neck. Mild atherosclerosis as above. Electronically Signed   By: Denny Flack M.D.   On: 09/18/2023 11:49   CT HEAD CODE STROKE WO CONTRAST Result Date: 09/18/2023 CLINICAL DATA:  Code stroke. 68 year old female. Left side deficit. Dizziness. EXAM: CT HEAD WITHOUT CONTRAST TECHNIQUE: Contiguous axial images were obtained from the base of the skull through the vertex without intravenous contrast. RADIATION DOSE REDUCTION: This exam was performed according to the departmental dose-optimization program which includes automated exposure control, adjustment of the mA and/or kV according to patient size and/or use of iterative reconstruction technique. COMPARISON:  Head CT 08/07/2020. FINDINGS: Brain: Cerebral volume is stable since 2022, normal for age. No midline shift, ventriculomegaly, mass effect, evidence of mass lesion, intracranial hemorrhage or evidence of cortically based acute infarction. Gray-white differentiation stable and within normal limits. Vascular: No suspicious intracranial vascular hyperdensity. Mild Calcified atherosclerosis at the skull base. Skull: Stable and intact. Sinuses/Orbits: Chronic sphenoid sinusitis with mucoperiosteal thickening is stable. Tympanic cavities and mastoids appear clear. Other: No gaze deviation. Visualized scalp soft tissues are within normal limits. ASPECTS [facility] Stroke Program Early CT Score) Total score (0-10 with 10 being normal): 10 IMPRESSION: 1. Stable since 2022 and normal for age noncontrast CT appearance of the brain. ASPECTS 10. 2. These results were communicated to Dr. Doretta Gant at 10:48 am on 09/18/2023 by text page via the [facility]  messaging system. Electronically Signed   By: Marlise Simpers M.D.   On: 09/18/2023 10:49    Microbiology: Results for orders placed or performed during the hospital encounter of 03/12/23  Group A Strep by PCR ([facility] Only)     Status: None   Collection Time: 03/12/23 12:29 AM   Specimen: Throat; Sterile Swab  Result Value Ref Range Status   Group A Strep by PCR NOT DETECTED NOT DETECTED Final    Comment: Performed at [facility], [address], [place], [place] [address]  Resp panel by RT-PCR (RSV, Flu A&B, Covid) Anterior Nasal Swab     Status: None   Collection Time: 03/12/23  2:00 AM   Specimen: Anterior Nasal Swab  Result Value Ref Range Status   SARS Coronavirus 2 by RT PCR NEGATIVE NEGATIVE Final    Comment: (NOTE) SARS-CoV-2 target nucleic acids are NOT DETECTED.  The SARS-CoV-2 RNA is generally detectable in upper respiratory specimens during the acute phase of infection. The lowest concentration of SARS-CoV-2 viral copies this assay  can detect is 138 copies/mL. A negative result does not preclude SARS-Cov-2 infection and should not be used as the sole basis for treatment or other patient management decisions. A negative result may occur with  improper specimen collection/handling, submission of specimen other than nasopharyngeal swab, presence of viral mutation(s) within the areas targeted by this assay, and inadequate number of viral copies(<138 copies/mL). A negative result must be combined with clinical observations, patient history, and epidemiological information. The expected result is Negative.  Fact Sheet for Patients:  BloggerCourse.com  Fact Sheet for Healthcare Providers:  SeriousBroker.it  This test is no t yet approved or cleared by the [place]  FDA and  has been authorized for detection and/or diagnosis of SARS-CoV-2 by FDA under an Emergency Use Authorization (EUA). This EUA will remain  in  effect (meaning this test can be used) for the duration of the COVID-19 declaration under Section 564(b)(1) of the Act, 21 U.S.C.section 360bbb-3(b)(1), unless the authorization is terminated  or revoked sooner.       Influenza A by PCR NEGATIVE NEGATIVE Final   Influenza B by PCR NEGATIVE NEGATIVE Final    Comment: (NOTE) The Xpert Xpress SARS-CoV-2/FLU/RSV plus assay is intended as an aid in the diagnosis of influenza from Nasopharyngeal swab specimens and should not be used as a sole basis for treatment. Nasal washings and aspirates are unacceptable for Xpert Xpress SARS-CoV-2/FLU/RSV testing.  Fact Sheet for Patients: BloggerCourse.com  Fact Sheet for Healthcare Providers: SeriousBroker.it  This test is not yet approved or cleared by the [place]  FDA and has been authorized for detection and/or diagnosis of SARS-CoV-2 by FDA under an Emergency Use Authorization (EUA). This EUA will remain in effect (meaning this test can be used) for the duration of the COVID-19 declaration under Section 564(b)(1) of the Act, 21 U.S.C. section 360bbb-3(b)(1), unless the authorization is terminated or revoked.     Resp Syncytial Virus by PCR NEGATIVE NEGATIVE Final    Comment: (NOTE) Fact Sheet for Patients: BloggerCourse.com  Fact Sheet for Healthcare Providers: SeriousBroker.it  This test is not yet approved or cleared by the [place]  FDA and has been authorized for detection and/or diagnosis of SARS-CoV-2 by FDA under an Emergency Use Authorization (EUA). This EUA will remain in effect (meaning this test can be used) for the duration of the COVID-19 declaration under Section 564(b)(1) of the Act, 21 U.S.C. section 360bbb-3(b)(1), unless the authorization is terminated or revoked.  Performed at [facility], [address] [place], [place] [address]      Labs: CBC: Recent Labs  Lab 09/18/23 1028  WBC 4.4  NEUTROABS 2.7  HGB 13.7  HCT 40.5  MCV 85.3  PLT 236   Basic Metabolic Panel: Recent Labs  Lab 09/18/23 1028  NA 140  K 4.2  CL 104  CO2 27  GLUCOSE 154*  BUN 19  CREATININE 0.66  CALCIUM 9.4   Liver Function Tests: Recent Labs  Lab 09/18/23 1028  AST 27  ALT 15  ALKPHOS 70  BILITOT 0.8  PROT 8.5*  ALBUMIN 4.5   CBG: Recent Labs  Lab 09/18/23 1036  GLUCAP 139*    Discharge time spent: 32 minutes.  Signed: Laresha Bacorn, DO [facility] 09/19/2023

## 2023-09-19 NOTE — Plan of Care (Signed)
  Problem: Education: Goal: Knowledge of disease or condition will improve Outcome: Adequate for Discharge Goal: Knowledge of secondary prevention will improve (MUST DOCUMENT ALL) Outcome: Adequate for Discharge Goal: Knowledge of patient specific risk factors will improve (DELETE if not current risk factor) Outcome: Adequate for Discharge   Problem: Ischemic Stroke/TIA Tissue Perfusion: Goal: Complications of ischemic stroke/TIA will be minimized Outcome: Adequate for Discharge   Problem: Coping: Goal: Will verbalize positive feelings about self Outcome: Adequate for Discharge Goal: Will identify appropriate support needs Outcome: Adequate for Discharge   Problem: Health Behavior/Discharge Planning: Goal: Ability to manage health-related needs will improve Outcome: Adequate for Discharge Goal: Goals will be collaboratively established with patient/family Outcome: Adequate for Discharge   Problem: Self-Care: Goal: Ability to participate in self-care as condition permits will improve Outcome: Adequate for Discharge Goal: Verbalization of feelings and concerns over difficulty with self-care will improve Outcome: Adequate for Discharge Goal: Ability to communicate needs accurately will improve Outcome: Adequate for Discharge   Problem: Nutrition: Goal: Risk of aspiration will decrease Outcome: Adequate for Discharge Goal: Dietary intake will improve Outcome: Adequate for Discharge   Problem: Education: Goal: Knowledge of General Education information will improve Description: Including pain rating scale, medication(s)/side effects and non-pharmacologic comfort measures Outcome: Adequate for Discharge   Problem: Acute Rehab PT Goals(only PT should resolve) Goal: Pt Will Go Sit To Supine/Side Outcome: Adequate for Discharge Goal: Pt Will Transfer Bed To Chair/Chair To Bed Outcome: Adequate for Discharge Goal: Pt Will Ambulate Outcome: Adequate for Discharge Goal: Pt Will  Go Up/Down Stairs Outcome: Adequate for Discharge

## 2023-09-19 NOTE — Progress Notes (Addendum)
 SLP Cancellation Note  Patient Details Name: Leah Luna MRN: [id number] DOB: 09-22-1955   Cancelled treatment:       Reason Eval/Treat Not Completed: SLP screened, no needs identified, will sign off (chart reviewed; consulted NSG and met w/ pt in room.)  Pt admitted to the ED w/ c/o dizziness and then developed left-sided numbness including left arm and left hand left side of torso and left leg and foot, also including left side of the tongue and lip. Denies any weakness of any of the limbs..  As of this morning, these s/s have greatly improved per pt; min decreased sensation around L eye, min tongue when asked about facial sensation. No OM weakness obvious during speech and drinking of liquids during screen visit.  MRI: No evidence of acute intracranial abnormality.     Pt denied any difficulty swallowing and is currently on a regular diet; tolerates swallowing pills w/ water per NSG. Recently finished breakfast meal w/ no deficits reported. Pt conversed in Full conversation in both [place] and min [place] w/out expressive/receptive deficits noted; pt denied any speech-language deficits. Speech clear, 100% intelligible. Lyle San [phone], Spanish Interpreter during session).  No further skilled ST services indicated as pt appears at her communication and swallowing baseline. Pt agreed. NSG to reconsult if any change in status while admitted.     Darla Edward, MS, CCC-SLP Speech Language Pathologist Rehab Services; [facility] [phone] (ascom) [place] 09/19/2023, 9:58 AM

## 2023-09-19 NOTE — Evaluation (Signed)
 Physical Therapy Evaluation Patient Details Name: Leah Luna MRN: [id number] DOB: 1956-04-03 Today's Date: 09/19/2023  History of Present Illness  Pt is a 68 y.o. female with medical history significant of HTN, GERD, hyperthyroidism on methimazole, anxiety/depression, multiple OA's, presented with strokelike symptoms.  MD assessment includes: Left-sided paresthesia with MRI impression stating no evidence of acute intracranial abnormality.   Clinical Impression  In house interpreter [name] utilized throughout the session.  Pt was pleasant and motivated to participate during the session and put forth good effort throughout. Pt required no physical assistance during the session and was generally steady with all standing activities.  During MMT pt did present with grossly decreased strength in her LUE and LLE but did state that she felt she was somewhat limited in her effort during the test by pain in the L extremities.  No significant deficits noted in functional strength with pt demonstrating good eccentric and concentric control and stability with transfers and while ascending/descending stairs.  Pt ambulated initially with a RW but quickly transitioned to amb without an AD with slow cadence and short B step length but was steady throughout.  Pt reported no adverse symptoms during the session with SpO2 and HR WNL throughout on room air. Pt will benefit from continued PT services upon discharge to safely address deficits listed in patient problem list for decreased caregiver assistance and eventual return to PLOF.           If plan is discharge home, recommend the following: A little help with walking and/or transfers;A little help with bathing/dressing/bathroom;Assist for transportation;Help with stairs or ramp for entrance;Assistance with cooking/housework   Can travel by private vehicle        Equipment Recommendations None recommended by PT  Recommendations for Other  Services       Functional Status Assessment Patient has had a recent decline in their functional status and demonstrates the ability to make significant improvements in function in a reasonable and predictable amount of time.     Precautions / Restrictions Precautions Precautions: Fall Recall of Precautions/Restrictions: Intact Restrictions Weight Bearing Restrictions Per Provider Order: No      Mobility  Bed Mobility Overal bed mobility: Modified Independent             General bed mobility comments: Min extra time and effort only    Transfers Overall transfer level: Needs assistance Equipment used: Rolling walker (2 wheels), None Transfers: Sit to/from Stand Sit to Stand: Supervision           General transfer comment: Good eccentric and concentric control and stability    Ambulation/Gait Ambulation/Gait assistance: Supervision Gait Distance (Feet): 250 Feet Assistive device: Rolling walker (2 wheels), None Gait Pattern/deviations: Step-through pattern, Decreased step length - right, Decreased step length - left Gait velocity: decreased     General Gait Details: Mildly decreased cadence and step length but grossly steady with no overt LOB; pt initially ambulated with a RW but quickly transitioned to amb without an AD with good stability throughout and no noted functional LLE weakness  Stairs Stairs: Yes Stairs assistance: Contact guard assist Stair Management: Two rails, Forwards, Alternating pattern, Step to pattern Number of Stairs: 4 General stair comments: Pt utilized alternating pattern ascending steps and step-to sequencing descending with cues to step down with LLE for general safety but no overt LOB or buckling noted  Wheelchair Mobility     Tilt Bed    Modified Rankin (Stroke Patients Only)  Balance Overall balance assessment: Needs assistance Sitting-balance support: No upper extremity supported, Feet supported Sitting  balance-Leahy Scale: Normal     Standing balance support: No upper extremity supported, During functional activity Standing balance-Leahy Scale: Good                               Pertinent Vitals/Pain Pain Assessment Pain Assessment: No/denies pain    Home Living Family/patient expects to be discharged to:: Private residence Living Arrangements: Spouse/significant other;Other relatives Available Help at Discharge: Family;Available 24 hours/day Type of Home: Mobile home Home Access: Stairs to enter Entrance Stairs-Rails: Right;Left;Can reach both Entrance Stairs-Number of Steps: 4-5   Home Layout: One level Home Equipment: Grab bars - tub/shower;Shower seat      Prior Function Prior Level of Function : Independent/Modified Independent;History of Falls (last six months)             Mobility Comments: Ind amb community distances without an AD, one fall in the last 6 months slipping in the shower ADLs Comments: Ind with ADLs     Extremity/Trunk Assessment   Upper Extremity Assessment Upper Extremity Assessment: Right hand dominant;RUE deficits/detail;LUE deficits/detail RUE Deficits / Details: Strength WFL RUE Sensation: WNL RUE Coordination: WNL LUE Deficits / Details: MMT shoulder flex 4/5, grip strength 4/5, elbow flexion and extension 4+/5 LUE Sensation: decreased light touch LUE Coordination: decreased fine motor (Mildly decreased compared to the R)    Lower Extremity Assessment Lower Extremity Assessment: RLE deficits/detail;LLE deficits/detail RLE Deficits / Details: Strength WFL RLE Sensation: WNL RLE Coordination: WNL LLE Deficits / Details: Knee ext 4/5, knee flex 3+/5, hip flex 3+/5, DF 3+/5 LLE Sensation: decreased light touch LLE Coordination: WNL       Communication   Communication Communication: No apparent difficulties (interpreter utilized)    Cognition Arousal: Alert Behavior During Therapy: WFL for tasks assessed/performed    PT - Cognitive impairments: No apparent impairments                         Following commands: Intact       Cueing Cueing Techniques: Verbal cues, Tactile cues     General Comments      Exercises     Assessment/Plan    PT Assessment Patient needs continued PT services  PT Problem List Decreased strength;Decreased balance;Decreased mobility       PT Treatment Interventions DME instruction;Gait training;Stair training;Therapeutic activities;Functional mobility training;Therapeutic exercise;Balance training;Patient/family education    PT Goals (Current goals can be found in the Care Plan section)  Acute Rehab PT Goals Patient Stated Goal: To be healthy with no more stroke symptoms PT Goal Formulation: With patient Time For Goal Achievement: 10/02/23 Potential to Achieve Goals: Good    Frequency Min 2X/week     Co-evaluation               AM-PAC PT 6 Clicks Mobility  Outcome Measure Help needed turning from your back to your side while in a flat bed without using bedrails?: None Help needed moving from lying on your back to sitting on the side of a flat bed without using bedrails?: None Help needed moving to and from a bed to a chair (including a wheelchair)?: A Little Help needed standing up from a chair using your arms (e.g., wheelchair or bedside chair)?: A Little Help needed to walk in hospital room?: A Little Help needed climbing 3-5 steps with a railing? :  A Little 6 Click Score: 20    End of Session Equipment Utilized During Treatment: Gait belt Activity Tolerance: Patient tolerated treatment well Patient left: in bed;with call bell/phone within reach;with bed alarm set;with nursing/sitter in room Nurse Communication: Mobility status PT Visit Diagnosis: Muscle weakness (generalized) (M62.81);Hemiplegia and hemiparesis;Other symptoms and signs involving the nervous system (R29.898) Hemiplegia - Right/Left: Left Hemiplegia -  dominant/non-dominant: Non-dominant Hemiplegia - caused by: Unspecified    Time: 1610-9604 PT Time Calculation (min) (ACUTE ONLY): 35 min   Charges:   PT Evaluation $PT Eval Moderate Complexity: 1 Mod PT Treatments $Gait Training: 8-22 mins PT General Charges $$ ACUTE PT VISIT: 1 Visit         D. Scott Lennix Kneisel PT, DPT 09/19/23, 1:55 PM

## 2023-09-19 NOTE — Progress Notes (Signed)
 MD order received in [facility] to discharge pt home today; [facility] Spanish interpreter utilized for Spanish discharge instructions; verbally reviewed AVS with pt including use of Zol monitor and instructions, no further questions voiced at this time; pt to get dressed by NT and taken to [facility] entrance for discharge

## 2023-10-19 ENCOUNTER — Ambulatory Visit: Admitting: Physician Assistant

## 2023-12-06 ENCOUNTER — Ambulatory Visit: Attending: Cardiology | Admitting: Cardiology

## 2023-12-06 ENCOUNTER — Encounter: Payer: Self-pay | Admitting: Cardiology

## 2023-12-06 VITALS — BP 130/64 | HR 76 | Ht 65.0 in | Wt 138.8 lb

## 2023-12-06 DIAGNOSIS — R42 Dizziness and giddiness: Secondary | ICD-10-CM | POA: Diagnosis not present

## 2023-12-06 DIAGNOSIS — G459 Transient cerebral ischemic attack, unspecified: Secondary | ICD-10-CM

## 2023-12-06 DIAGNOSIS — I10 Essential (primary) hypertension: Secondary | ICD-10-CM | POA: Diagnosis not present

## 2023-12-06 NOTE — Progress Notes (Signed)
 Cardiology Office Note:    Date:  12/06/2023   ID:  Leah Luna, DOB [birth year], MRN [id number]  PCP:  Geofm Nancie SQUIBB, MD   [facility] Providers Cardiologist:  None     Referring MD: Geofm Nancie SQUIBB, MD   Chief Complaint  Patient presents with   Establish Care    New pt has been doing well with no complaints of chest pain, chest pressure or SOB, ,pt states that she gets dizzy when she changes positions, medication reviewed verbally with patient    History of Present Illness:    Leah Luna is a 68 y.o. female with a hx of hypertension, hypothyroidism, anxiety, depression, TIA who presents for hospital follow-up.  Admitted 3 months ago for left-sided numbness in the left arm and hand.  Head CT and MRI was negative for acute CVA.  LDL was elevated.  Started on statin, Plavix .  Cardiac monitor placed upon discharge to evaluate for any A-fib or flutter.  Echocardiogram 09/2023 showed normal EF 55 to 60%.  Patient states turning in cardiac monitor, results are still pending.  Ever since her stroke, she has had worsening symptoms of dizziness with changing positions.  Denies syncope, palpitations.   Past Medical History:  Diagnosis Date   Hypertension     History reviewed. No pertinent surgical history.  Current Medications: Current Meds  Medication Sig   acetaminophen  (TYLENOL ) 650 MG CR tablet Take 650 mg by mouth every 8 (eight) hours as needed for pain.   amLODipine (NORVASC) 5 MG tablet Take 5 mg by mouth daily.   atorvastatin  (LIPITOR) 40 MG tablet Take 1 tablet (40 mg total) by mouth daily.   busPIRone  (BUSPAR ) 7.5 MG tablet Take 7.5 mg by mouth 2 (two) times daily.   CALCIUM  ANTACID 500 MG chewable tablet Chew by mouth.   clopidogrel  (PLAVIX ) 75 MG tablet Take 1 tablet (75 mg total) by mouth daily.   diclofenac  Sodium (VOLTAREN ) 1 % GEL Apply topically 4 (four) times daily.   ergocalciferol (VITAMIN D2) 1.25 MG (50000 UT)  capsule Take 50,000 Units by mouth once a week.   fluticasone  (FLONASE ) 50 MCG/ACT nasal spray Place into both nostrils daily.   gabapentin  (NEURONTIN ) 100 MG capsule Take 100 mg by mouth at bedtime.   hydrochlorothiazide (HYDRODIURIL) 12.5 MG tablet Take 12.5 mg by mouth daily.   ibuprofen  (ADVIL ) 800 MG tablet Take 1 tablet (800 mg total) by mouth every 8 (eight) hours as needed.   lidocaine  (XYLOCAINE ) 2 % solution Use as directed 15 mLs in the mouth or throat every 4 (four) hours as needed for mouth pain.   MAG-G 500 (27 Mg) MG TABS tablet Take 500 mg by mouth daily.   omeprazole (PRILOSEC) 40 MG capsule Take 40 mg by mouth daily.   PATADAY  0.1 % ophthalmic solution Place 1 drop into both eyes 2 (two) times daily.   SENNA PLUS 8.6-50 MG tablet Take 2 tablets by mouth at bedtime.   sertraline  (ZOLOFT ) 100 MG tablet Take 100 mg by mouth daily.     Allergies:   Amoxicillin, Oyster shell calcium -d, Egg-derived products, Omeprazole, and Pork-derived products   Social History   Socioeconomic History   Marital status: Married    Spouse name: Not on file   Number of children: Not on file   Years of education: Not on file   Highest education level: Not on file  Occupational History   Not on file  Tobacco Use  Smoking status: Never    Passive exposure: Never   Smokeless tobacco: Never  Vaping Use   Vaping status: Never Used  Substance and Sexual Activity   Alcohol use: No    Alcohol/week: 0.0 standard drinks of alcohol   Drug use: No   Sexual activity: Yes  Other Topics Concern   Not on file  Social History Narrative   Not on file   Social Drivers of Health   Financial Resource Strain: High Risk (10/24/2023)   Received from [facility]   Overall Financial Resource Strain (CARDIA)    Difficulty of Paying Living Expenses: Hard  Food Insecurity: Food Insecurity Present (10/24/2023)   Received from [facility]   Hunger Vital Sign    Within  the past 12 months, you worried that your food would run out before you got the money to buy more.: Often true    Within the past 12 months, the food you bought just didn't last and you didn't have money to get more.: Sometimes true  Transportation Needs: No Transportation Needs (10/24/2023)   Received from [facility] - Transportation    In the past 12 months, has lack of transportation kept you from medical appointments or from getting medications?: No    Lack of Transportation (Non-Medical): No  Physical Activity: Not on file  Stress: Not on file  Social Connections: Unknown (09/18/2023)   Social Connection and Isolation Panel    Frequency of Communication with Friends and Family: Patient declined    Frequency of Social Gatherings with Friends and Family: Patient declined    Attends Religious Services: Patient declined    Database administrator or Organizations: Patient declined    Attends Banker Meetings: Patient declined    Marital Status: Married     Family History: The patient's family history is negative for Breast cancer.  ROS:   Please see the history of present illness.     All other systems reviewed and are negative.  EKGs/Labs/Other Studies Reviewed:    The following studies were reviewed today:  EKG Interpretation Date/Time:  Wednesday December 06 2023 09:25:33 EDT Ventricular Rate:  76 PR Interval:  148 QRS Duration:  78 QT Interval:  374 QTC Calculation: 420 R Axis:   -42  Text Interpretation: Normal sinus rhythm Left axis deviation Pulmonary disease pattern Nonspecific ST abnormality Confirmed by Darliss Rogue ([id number]) on 12/06/2023 9:35:40 AM    Recent Labs: 09/18/2023: ALT 15; BUN 19; Creatinine, Ser 0.66; Hemoglobin 13.7; Platelets 236; Potassium 4.2; Sodium 140; TSH 1.310  Recent Lipid Panel    Component Value Date/Time   CHOL 198 09/19/2023 0518   TRIG 124 09/19/2023 0518   HDL 43 09/19/2023 0518    CHOLHDL 4.6 09/19/2023 0518   VLDL 25 09/19/2023 0518   LDLCALC 130 (H) 09/19/2023 0518     Risk Assessment/Calculations:             Physical Exam:    VS:  BP 130/64 (BP Location: Left Arm, Patient Position: Sitting, Cuff Size: Normal)   Pulse 76   Ht 5' 5 (1.651 m)   Wt 138 lb 12.8 oz (63 kg)   LMP 03/03/2004   SpO2 99%   BMI 23.10 kg/m     Wt Readings from Last 3 Encounters:  12/06/23 138 lb 12.8 oz (63 kg)  09/18/23 180 lb (81.6 kg)  09/18/22 143 lb (64.9 kg)     GEN:  Well  nourished, well developed in no acute distress HEENT: Normal NECK: No JVD; No carotid bruits CARDIAC: RRR, no murmurs, rubs, gallops RESPIRATORY:  Clear to auscultation without rales, wheezing or rhonchi  ABDOMEN: Soft, non-tender, non-distended MUSCULOSKELETAL:  No edema; No deformity  SKIN: Warm and dry NEUROLOGIC:  Alert and oriented x 3 PSYCHIATRIC:  Normal affect   ASSESSMENT:    1. TIA (transient ischemic attack)   2. Primary hypertension   3. Dizziness    PLAN:    In order of problems listed above:  TIA, echo with normal EF.  Cardiac monitor pending.  Continue Plavix , Lipitor 40 mg daily.  Review cardiac monitor when available.  Will reach out to PCP for monitor results. Hypertension, BP controlled.  Continue Norvasc 5 mg daily, HCTZ 12.5 mg daily. Dizziness, orthostatic vitals with no evidence for orthostasis.  Patient did have dizziness with changing positions suggesting positional vertigo.  Recent CVA may have induced or made this worse.  Follow-up with PCP regarding this.  Consider ENT.  Follow-up in 3 months       Medication Adjustments/Labs and Tests Ordered: Current medicines are reviewed at length with the patient today.  Concerns regarding medicines are outlined above.  Orders Placed This Encounter  Procedures   EKG 12-Lead   No orders of the defined types were placed in this encounter.   Patient Instructions  Medication Instructions:  Your physician  recommends that you continue on your current medications as directed. Please refer to the Current Medication list given to you today.   *If you need a refill on your cardiac medications before your next appointment, please call your pharmacy*  Lab Work: No labs ordered today  If you have labs (blood work) drawn today and your tests are completely normal, you will receive your results only by: MyChart Message (if you have MyChart) OR A paper copy in the mail If you have any lab test that is abnormal or we need to change your treatment, we will call you to review the results.  Testing/Procedures: No test ordered today   Follow-Up: At [facility], you and your health needs are our priority.  As part of our continuing mission to provide you with exceptional heart care, our providers are all part of one team.  This team includes your primary Cardiologist (physician) and Advanced Practice Providers or APPs (Physician Assistants and Nurse Practitioners) who all work together to provide you with the care you need, when you need it.  Your next appointment:   3 month(s)  Provider:   You may see Dr. Darliss or one of the following Advanced Practice Providers on your designated Care Team:   Lonni Meager, NP Lesley Maffucci, PA-C Bernardino Bring, PA-C Cadence [place], PA-C Tylene Lunch, NP Barnie Hila, NP    We recommend signing up for the patient portal called MyChart.  Sign up information is provided on this After Visit Summary.  MyChart is used to connect with patients for Virtual Visits (Telemedicine).  Patients are able to view lab/test results, encounter notes, upcoming appointments, etc.  Non-urgent messages can be sent to your provider as well.   To learn more about what you can do with MyChart, go to [email or web address].         Signed, Redell Darliss, MD  12/06/2023 10:14 AM    [facility]

## 2023-12-06 NOTE — Patient Instructions (Signed)
 Medication Instructions:  Your physician recommends that you continue on your current medications as directed. Please refer to the Current Medication list given to you today.   *If you need a refill on your cardiac medications before your next appointment, please call your pharmacy*  Lab Work: No labs ordered today  If you have labs (blood work) drawn today and your tests are completely normal, you will receive your results only by: MyChart Message (if you have MyChart) OR A paper copy in the mail If you have any lab test that is abnormal or we need to change your treatment, we will call you to review the results.  Testing/Procedures: No test ordered today   Follow-Up: At [facility], you and your health needs are our priority.  As part of our continuing mission to provide you with exceptional heart care, our providers are all part of one team.  This team includes your primary Cardiologist (physician) and Advanced Practice Providers or APPs (Physician Assistants and Nurse Practitioners) who all work together to provide you with the care you need, when you need it.  Your next appointment:   3 month(s)  Provider:   You may see Dr. Darliss or one of the following Advanced Practice Providers on your designated Care Team:   Lonni Meager, NP Lesley Maffucci, PA-C Bernardino Bring, PA-C Cadence [place], PA-C Tylene Lunch, NP Barnie Hila, NP    We recommend signing up for the patient portal called MyChart.  Sign up information is provided on this After Visit Summary.  MyChart is used to connect with patients for Virtual Visits (Telemedicine).  Patients are able to view lab/test results, encounter notes, upcoming appointments, etc.  Non-urgent messages can be sent to your provider as well.   To learn more about what you can do with MyChart, go to [email or web address].

## 2023-12-12 ENCOUNTER — Other Ambulatory Visit: Payer: Self-pay | Admitting: Family Medicine

## 2023-12-12 ENCOUNTER — Ambulatory Visit: Admission: RE | Admit: 2023-12-12 | Source: Home / Self Care

## 2023-12-12 DIAGNOSIS — R296 Repeated falls: Secondary | ICD-10-CM

## 2023-12-15 ENCOUNTER — Other Ambulatory Visit: Payer: Self-pay

## 2023-12-15 ENCOUNTER — Emergency Department

## 2023-12-15 ENCOUNTER — Emergency Department
Admission: EM | Admit: 2023-12-15 | Discharge: 2023-12-15 | Disposition: A | Discharge status: Home / Self Care | Attending: Emergency Medicine | Admitting: Emergency Medicine

## 2023-12-15 DIAGNOSIS — Z7901 Long term (current) use of anticoagulants: Secondary | ICD-10-CM | POA: Diagnosis not present

## 2023-12-15 DIAGNOSIS — R519 Headache, unspecified: Secondary | ICD-10-CM | POA: Diagnosis present

## 2023-12-15 DIAGNOSIS — Z79899 Other long term (current) drug therapy: Secondary | ICD-10-CM | POA: Diagnosis not present

## 2023-12-15 DIAGNOSIS — I10 Essential (primary) hypertension: Secondary | ICD-10-CM | POA: Diagnosis not present

## 2023-12-15 DIAGNOSIS — R200 Anesthesia of skin: Secondary | ICD-10-CM | POA: Diagnosis not present

## 2023-12-15 LAB — DIFFERENTIAL
Abs Immature Granulocytes: 0.03 K/uL (ref 0.00–0.07)
Basophils Absolute: 0 K/uL (ref 0.0–0.1)
Basophils Relative: 1 %
Eosinophils Absolute: 0.2 K/uL (ref 0.0–0.5)
Eosinophils Relative: 3 %
Immature Granulocytes: 1 %
Lymphocytes Relative: 31 %
Lymphs Abs: 1.8 K/uL (ref 0.7–4.0)
Monocytes Absolute: 0.4 K/uL (ref 0.1–1.0)
Monocytes Relative: 7 %
Neutro Abs: 3.4 K/uL (ref 1.7–7.7)
Neutrophils Relative %: 57 %

## 2023-12-15 LAB — COMPREHENSIVE METABOLIC PANEL WITH GFR
ALT: 12 U/L (ref 0–44)
AST: 21 U/L (ref 15–41)
Albumin: 4.3 g/dL (ref 3.5–5.0)
Alkaline Phosphatase: 70 U/L (ref 38–126)
Anion gap: 13 (ref 5–15)
BUN: 13 mg/dL (ref 8–23)
CO2: 27 mmol/L (ref 22–32)
Calcium: 9.7 mg/dL (ref 8.9–10.3)
Chloride: 100 mmol/L (ref 98–111)
Creatinine, Ser: 0.71 mg/dL (ref 0.44–1.00)
GFR, Estimated: >60 mL/min (ref >60)
Glucose, Bld: 140 mg/dL — ABNORMAL HIGH (ref 70–99)
Potassium: 3.4 mmol/L — ABNORMAL LOW (ref 3.5–5.1)
Sodium: 140 mmol/L (ref 135–145)
Total Bilirubin: 0.5 mg/dL (ref 0.0–1.2)
Total Protein: 8.2 g/dL — ABNORMAL HIGH (ref 6.5–8.1)

## 2023-12-15 LAB — URINE DRUG SCREEN, QUALITATIVE (ARMC ONLY)
Amphetamines, Ur Screen: NOT DETECTED
Barbiturates, Ur Screen: NOT DETECTED
Benzodiazepine, Ur Scrn: NOT DETECTED
Cannabinoid 50 Ng, Ur ~~LOC~~: NOT DETECTED
Cocaine Metabolite,Ur ~~LOC~~: NOT DETECTED
MDMA (Ecstasy)Ur Screen: NOT DETECTED
Methadone Scn, Ur: NOT DETECTED
Opiate, Ur Screen: NOT DETECTED
Phencyclidine (PCP) Ur S: NOT DETECTED
Tricyclic, Ur Screen: NOT DETECTED

## 2023-12-15 LAB — PROTIME-INR
INR: 1 (ref 0.8–1.2)
Prothrombin Time: 13.6 s (ref 11.4–15.2)

## 2023-12-15 LAB — URINALYSIS, ROUTINE W REFLEX MICROSCOPIC
Bilirubin Urine: NEGATIVE
Glucose, UA: NEGATIVE mg/dL
Hgb urine dipstick: NEGATIVE
Ketones, ur: NEGATIVE mg/dL
Leukocytes,Ua: NEGATIVE
Nitrite: NEGATIVE
Protein, ur: NEGATIVE mg/dL
Specific Gravity, Urine: 1.002 — ABNORMAL LOW (ref 1.005–1.030)
pH: 7 (ref 5.0–8.0)

## 2023-12-15 LAB — CBC
HCT: 39.6 % (ref 36.0–46.0)
Hemoglobin: 13.4 g/dL (ref 12.0–15.0)
MCH: 28.3 pg (ref 26.0–34.0)
MCHC: 33.8 g/dL (ref 30.0–36.0)
MCV: 83.5 fL (ref 80.0–100.0)
Platelets: 305 K/uL (ref 150–400)
RBC: 4.74 MIL/uL (ref 3.87–5.11)
RDW: 12.6 % (ref 11.5–15.5)
WBC: 5.9 K/uL (ref 4.0–10.5)
nRBC: 0 % (ref 0.0–0.2)

## 2023-12-15 LAB — ETHANOL: Alcohol, Ethyl (B): <15 mg/dL (ref <15)

## 2023-12-15 LAB — APTT: aPTT: 32 s (ref 24–36)

## 2023-12-15 MED ORDER — METOCLOPRAMIDE HCL 5 MG/ML IJ SOLN
5.0000 mg | Freq: Once | INTRAMUSCULAR | Status: AC
  Administered 2023-12-15: 5 mg via INTRAVENOUS
  Filled 2023-12-15: qty 2

## 2023-12-15 MED ORDER — POTASSIUM CHLORIDE CRYS ER 20 MEQ PO TBCR
40.0000 meq | EXTENDED_RELEASE_TABLET | Freq: Once | ORAL | Status: AC
  Administered 2023-12-15: 40 meq via ORAL
  Filled 2023-12-15: qty 2

## 2023-12-15 MED ORDER — SODIUM CHLORIDE 0.9 % IV BOLUS (SEPSIS)
1000.0000 mL | Freq: Once | INTRAVENOUS | Status: AC
  Administered 2023-12-15: 1000 mL via INTRAVENOUS

## 2023-12-15 MED ORDER — ACETAMINOPHEN 500 MG PO TABS
1000.0000 mg | ORAL_TABLET | Freq: Once | ORAL | Status: AC
  Administered 2023-12-15: 1000 mg via ORAL
  Filled 2023-12-15: qty 2

## 2023-12-15 MED ORDER — PROCHLORPERAZINE EDISYLATE 10 MG/2ML IJ SOLN
10.0000 mg | Freq: Once | INTRAMUSCULAR | Status: AC
  Administered 2023-12-15: 10 mg via INTRAVENOUS
  Filled 2023-12-15: qty 2

## 2023-12-15 MED ORDER — KETOROLAC TROMETHAMINE 30 MG/ML IJ SOLN
15.0000 mg | Freq: Once | INTRAMUSCULAR | Status: AC
  Administered 2023-12-15: 15 mg via INTRAVENOUS
  Filled 2023-12-15: qty 1

## 2023-12-15 NOTE — ED Provider Notes (Signed)
 [facility] Provider Note    Event Date/Time   First MD Initiated Contact with Patient 12/15/23 [phone]     (approximate)   History   Dizziness   HPI  Leah Luna is a 68 y.o. female with history of hypertension, TIA in June 2025 who presents to the emergency department 3 to 4 days of headaches, left-sided numbness in her face, arm and leg.  No chest pain, shortness of breath.  No weakness.  No head injury.  No fever.   History provided by patient, sons using Spanish interpreter.    Past Medical History:  Diagnosis Date   Hypertension     History reviewed. No pertinent surgical history.  MEDICATIONS:  Prior to Admission medications   Medication Sig Start Date End Date Taking? Authorizing Provider  acetaminophen  (TYLENOL ) 650 MG CR tablet Take 650 mg by mouth every 8 (eight) hours as needed for pain.    [provider]  amLODipine (NORVASC) 5 MG tablet Take 5 mg by mouth daily.    [provider]  atorvastatin  (LIPITOR) 40 MG tablet Take 1 tablet (40 mg total) by mouth daily. 09/19/23   Dezii, Alexandra, DO  busPIRone  (BUSPAR ) 7.5 MG tablet Take 7.5 mg by mouth 2 (two) times daily.    [provider]  CALCIUM  ANTACID 500 MG chewable tablet Chew by mouth. 09/02/21   [provider]  clopidogrel  (PLAVIX ) 75 MG tablet Take 1 tablet (75 mg total) by mouth daily. 09/20/23 12/19/23  Dezii, Alexandra, DO  diclofenac  Sodium (VOLTAREN ) 1 % GEL Apply topically 4 (four) times daily.    [provider]  ergocalciferol (VITAMIN D2) 1.25 MG (50000 UT) capsule Take 50,000 Units by mouth once a week.    [provider]  fluticasone  (FLONASE ) 50 MCG/ACT nasal spray Place into both nostrils daily.    [provider]  gabapentin  (NEURONTIN ) 100 MG capsule Take 100 mg by mouth at bedtime.    [provider]  hydrochlorothiazide (HYDRODIURIL) 12.5 MG tablet Take 12.5 mg by mouth daily.     [provider]  ibuprofen  (ADVIL ) 800 MG tablet Take 1 tablet (800 mg total) by mouth every 8 (eight) hours as needed. 03/12/23   Chyann Ambrocio, Josette SAILOR, DO  lidocaine  (XYLOCAINE ) 2 % solution Use as directed 15 mLs in the mouth or throat every 4 (four) hours as needed for mouth pain. 03/12/23   Rowdy Guerrini, Josette SAILOR, DO  MAG-G 500 (27 Mg) MG TABS tablet Take 500 mg by mouth daily.    [provider]  methimazole  (TAPAZOLE ) 5 MG tablet Take 5 mg by mouth daily. Patient not taking: Reported on 12/06/2023    [provider]  omeprazole (PRILOSEC) 40 MG capsule Take 40 mg by mouth daily.    [provider]  PATADAY  0.1 % ophthalmic solution Place 1 drop into both eyes 2 (two) times daily. 07/05/23   [provider]  SENNA PLUS 8.6-50 MG tablet Take 2 tablets by mouth at bedtime.    [provider]  sertraline  (ZOLOFT ) 100 MG tablet Take 100 mg by mouth daily.    [provider]    Physical Exam   Triage Vital Signs: ED Triage Vitals  Encounter Vitals Group     BP 12/15/23 0214 (!) 153/83     Girls Systolic BP Percentile --      Girls Diastolic BP Percentile --      Boys Systolic BP Percentile --  Boys Diastolic BP Percentile --      Pulse Rate 12/15/23 0214 85     Resp 12/15/23 0214 17     Temp 12/15/23 0214 98.8 F (37.1 C)     Temp src --      SpO2 12/15/23 0214 98 %     Weight 12/15/23 0213 137 lb (62.1 kg)     Height 12/15/23 0213 5' 4 (1.626 m)     Head Circumference --      Peak Flow --      Pain Score 12/15/23 0213 8     Pain Loc --      Pain Education --      Exclude from Growth Chart --     Most recent vital signs: Vitals:   12/15/23 0530 12/15/23 0600  BP: 118/71 131/71  Pulse: 82 67  Resp: 18 18  Temp:    SpO2: 100% 100%    CONSTITUTIONAL: Alert, responds appropriately to questions. Well-appearing; well-nourished HEAD: Normocephalic, atraumatic EYES: Conjunctivae clear, pupils appear equal, sclera  nonicteric ENT: normal nose; moist mucous membranes NECK: Supple, normal ROM, no meningismus CARD: RRR; S1 and S2 appreciated RESP: Normal chest excursion without splinting or tachypnea; breath sounds clear and equal bilaterally; no wheezes, no rhonchi, no rales, no hypoxia or respiratory distress, speaking full sentences ABD/GI: Non-distended; soft, non-tender, no rebound, no guarding, no peritoneal signs BACK: The back appears normal EXT: Normal ROM in all joints; no deformity noted, no edema SKIN: Normal color for age and race; warm; no rash on exposed skin NEURO: Moves all extremities equally, normal speech, decrease sensation in the left face, arm and leg compared to the right.  No drift.  Normal gait.  Cranial nerves II through XII intact. PSYCH: The patient's mood and manner are appropriate.   ED Results / Procedures / Treatments   LABS: (all labs ordered are listed, but only abnormal results are displayed) Labs Reviewed  COMPREHENSIVE METABOLIC PANEL WITH GFR - Abnormal; Notable for the following components:      Result Value   Potassium 3.4 (*)    Glucose, Bld 140 (*)    Total Protein 8.2 (*)    All other components within normal limits  URINALYSIS, ROUTINE W REFLEX MICROSCOPIC - Abnormal; Notable for the following components:   Color, Urine COLORLESS (*)    APPearance CLEAR (*)    Specific Gravity, Urine 1.002 (*)    All other components within normal limits  PROTIME-INR  APTT  CBC  DIFFERENTIAL  ETHANOL  URINE DRUG SCREEN, QUALITATIVE ([facility] ONLY)     EKG:  EKG Interpretation Date/Time:  Friday December 15 2023 03:30:49 EDT Ventricular Rate:  76 PR Interval:  160 QRS Duration:  91 QT Interval:  376 QTC Calculation: 423 R Axis:   -65  Text Interpretation: Sinus rhythm Left anterior fascicular block Borderline T abnormalities, anterior leads Confirmed by Neomi Neptune [id number]) on 12/15/2023 5:49:33 AM         RADIOLOGY: My personal review and  interpretation of imaging: MRI brain shows no stroke.  CT head shows no intracranial hemorrhage.  I have personally reviewed all radiology reports.   MR BRAIN WO CONTRAST Result Date: 12/15/2023 CLINICAL DATA:  68 year old female with persistent headache, dizziness, left side weakness. Code stroke presentation in June with negative imaging at that time. EXAM: MRI HEAD WITHOUT CONTRAST TECHNIQUE: Multiplanar, multiecho pulse sequences of the brain and surrounding structures were obtained without intravenous contrast. COMPARISON:  Brain MRI 09/19/2023. FINDINGS: Brain: No  restricted diffusion to suggest acute infarction. No midline shift, mass effect, evidence of mass lesion, ventriculomegaly, extra-axial collection or acute intracranial hemorrhage. Cervicomedullary junction and pituitary are within normal limits. Scattered small T2 and FLAIR hyperintense foci in the bilateral cerebral white matter, extent is mild to moderate for age and stable compared to June MRI. No cortical encephalomalacia or chronic cerebral blood products identified. Deep gray nuclei, brainstem and cerebellum appear negative. Vascular: Major intracranial vascular flow voids are stable. Skull and upper cervical spine: Visualized bone marrow signal is within normal limits. Normal for age visible cervical spine. Sinuses/Orbits: Chronic sphenoid sinus disease. Orbits appears stable and negative. Other: Mastoids remain clear. Grossly normal visible internal auditory structures. Stylomastoid foramina, visible scalp and face appear negative. IMPRESSION: 1. No acute intracranial abnormality. 2. Mild to moderate for age white matter signal changes, nonspecific but most commonly due to chronic small vessel disease, stable since June. 3. Chronic sphenoid sinus disease. Electronically Signed   By: VEAR Hurst M.D.   On: 12/15/2023 04:21   CT HEAD WO CONTRAST Result Date: 12/15/2023 EXAM: CT HEAD WITHOUT CONTRAST 12/15/2023 03:16:42 AM TECHNIQUE: CT of  the head was performed without the administration of intravenous contrast. Automated exposure control, iterative reconstruction, and/or weight based adjustment of the mA/kV was utilized to reduce the radiation dose to as low as reasonably achievable. COMPARISON: CT and MRI 09/18/2023 CLINICAL HISTORY: Neuro deficit, acute, stroke suspected. Per ed notes; Pt reports for the past 4 days she has had dizziness and left weakness, pt has hx stroke 2 months ago. Family reports pts speech is mumbled. Pt also having a headache. FINDINGS: BRAIN AND VENTRICLES: No acute hemorrhage. No evidence of acute infarct. No hydrocephalus. No extra-axial collection. No mass effect or midline shift. ORBITS: No acute abnormality. SINUSES: Chronic sphenoid sinusitis. SOFT TISSUES AND SKULL: No acute soft tissue abnormality. No skull fracture. IMPRESSION: 1. No acute intracranial abnormality. Electronically signed by: Norman Gatlin MD 12/15/2023 03:48 AM EDT RP Workstation: [id number]     PROCEDURES:  Critical Care performed: No     .1-3 Lead EKG Interpretation  Performed by: Sahian Kerney, Josette SAILOR, DO Authorized by: Sheilla Maris, Josette SAILOR, DO     Interpretation: normal     ECG rate:  85   ECG rate assessment: normal     Rhythm: sinus rhythm     Ectopy: none     Conduction: normal       IMPRESSION / MDM / ASSESSMENT AND PLAN / ED COURSE  I reviewed the triage vital signs and the nursing notes.    Patient here with headache, left-sided numbness.  The patient is on the cardiac monitor to evaluate for evidence of arrhythmia and/or significant heart rate changes.   DIFFERENTIAL DIAGNOSIS (includes but not limited to):   Stroke, TIA, intracranial hemorrhage, complex migraine, doubt ruptured aneurysm, meningitis, cavernous sinus thrombosis   Patient's presentation is most consistent with acute presentation with potential threat to life or bodily function.   PLAN: Will obtain labs, urine, CT head, MRI brain.  Patient  just underwent stroke workup in June 2025.  MRI of the brain at that time was unremarkable, CTA head and neck showed no large vessel occlusion, stenosis, aneurysm, dissection.  Will give migraine cocktail here and reassess.   MEDICATIONS GIVEN IN ED: Medications  ketorolac  (TORADOL ) 30 MG/ML injection 15 mg (has no administration in time range)  metoCLOPramide  (REGLAN ) injection 5 mg (has no administration in time range)  acetaminophen  (TYLENOL ) tablet 1,000 mg (1,000 mg  Oral Given 12/15/23 0340)  prochlorperazine  (COMPAZINE ) injection 10 mg (10 mg Intravenous Given 12/15/23 0342)  sodium chloride  0.9 % bolus 1,000 mL (0 mLs Intravenous Stopped 12/15/23 0504)  potassium chloride  SA (KLOR-CON  M) CR tablet 40 mEq (40 mEq Oral Given 12/15/23 0521)     ED COURSE: Labs unremarkable other than potassium level 3.4.  Given replacement.  Normal electrolytes otherwise, hemoglobin, glucose.  CT head, MRI brain reviewed and interpreted by myself and radiologist and are unremarkable.  Urine pending.  6:30 AM  Pt's urine shows no sign of infection.  Reports headache improved but not completely resolved but still having numbness.  Given numbness still present in the setting of a negative MRI and it involves her face as well as her extremities, I suspect that this is more likely a complex migraine than stroke or TIA.  Will give additional migraine medications.  Anticipate discharge home with sons today once feeling better with outpatient neuro follow-up.  At this time, I do not feel there is any life-threatening condition present. I reviewed all nursing notes, vitals, pertinent previous records.  All lab and urine results, EKGs, imaging ordered have been independently reviewed and interpreted by myself.  I reviewed all available radiology reports from any imaging ordered this visit.  Based on my assessment, I feel the patient is safe to be discharged home without further emergent workup and can continue workup as an  outpatient as needed. Discussed all findings, treatment plan as well as usual and customary return precautions.  They verbalize understanding and are comfortable with this plan.  Outpatient follow-up has been provided as needed.  All questions have been answered.  CONSULTS:  none   OUTSIDE RECORDS REVIEWED: Reviewed recent hospitalization.       FINAL CLINICAL IMPRESSION(S) / ED DIAGNOSES   Final diagnoses:  Generalized headache  Left sided numbness     Rx / DC Orders   ED Discharge Orders     None        Note:  This document was prepared using Dragon voice recognition software and may include unintentional dictation errors.   Ymani Porcher, Josette SAILOR, DO 12/15/23 [phone]

## 2023-12-15 NOTE — ED Notes (Signed)
 Pt called out. Pt needed to use the BR. This NT assisted pt to the BR. Pt needed urine sample. Urine sample collected. Pt back to bed. Pt requested warm blankets. Pt resting comfortably at this time.

## 2023-12-15 NOTE — Discharge Instructions (Addendum)
 I suspect that you are having a complex migraine.  Your MRI showed no stroke today.  Your blood work, urine were reassuring.  I recommend close follow-up with your PCP and we have given you follow-up information for neurology as well.  You may alternate over the counter Tylenol  1000 mg every 6 hours as needed for pain, fever and Ibuprofen  600 mg every 8 hours as needed for pain, fever.  Please take Ibuprofen  with food.  Do not take more than 4000 mg of Tylenol  (acetaminophen ) in a 24 hour period.   Sospecho que tiene una migraa compleja. Su resonancia magntica de hoy no mostr un derrame cerebral. Sus anlisis de Bowman y comoros fueron tranquilizadores. Le recomiendo un seguimiento cercano con su mdico de cabecera y tambin le hemos proporcionado informacin de seguimiento para Software engineer.  Puede alternar entre Tylenol  de venta libre de 1000 mg cada 6 horas, segn sea necesario, para el dolor y la fiebre, e ibuprofeno de 600 mg cada 8 horas, segn sea necesario, para el dolor y Insurance account manager. Tome el ibuprofeno con alimentos. No tome ms de 4000 mg de Tylenol  (paracetamol) en un perodo de 24 horas.

## 2023-12-15 NOTE — ED Triage Notes (Addendum)
 Pt reports for the past 4 days she has had dizziness and left weakness, pt has hx stroke 2 months ago. Family reports pts speech is mumbled. Pt also having a headache.

## 2023-12-15 NOTE — ED Notes (Signed)
Video interpretor used for discharge

## 2023-12-25 ENCOUNTER — Other Ambulatory Visit: Payer: Self-pay | Admitting: Family Medicine

## 2023-12-25 DIAGNOSIS — M5412 Radiculopathy, cervical region: Secondary | ICD-10-CM

## 2023-12-25 DIAGNOSIS — M542 Cervicalgia: Secondary | ICD-10-CM

## 2023-12-25 DIAGNOSIS — R42 Dizziness and giddiness: Secondary | ICD-10-CM

## 2023-12-26 ENCOUNTER — Ambulatory Visit
Admission: RE | Admit: 2023-12-26 | Discharge: 2023-12-26 | Disposition: A | Discharge status: Home / Self Care | Source: Ambulatory Visit | Attending: Family Medicine | Admitting: Family Medicine

## 2023-12-26 DIAGNOSIS — R42 Dizziness and giddiness: Secondary | ICD-10-CM | POA: Insufficient documentation

## 2023-12-26 DIAGNOSIS — M5412 Radiculopathy, cervical region: Secondary | ICD-10-CM | POA: Diagnosis present

## 2023-12-26 DIAGNOSIS — M542 Cervicalgia: Secondary | ICD-10-CM | POA: Diagnosis present

## 2023-12-26 MED ORDER — GADOBUTROL 1 MMOL/ML IV SOLN
6.0000 mL | Freq: Once | INTRAVENOUS | Status: AC | PRN
  Administered 2023-12-26: 6 mL via INTRAVENOUS

## 2024-03-06 ENCOUNTER — Ambulatory Visit: Attending: Cardiology | Admitting: Cardiology

## 2024-03-06 ENCOUNTER — Encounter: Payer: Self-pay | Admitting: Cardiology

## 2024-03-06 VITALS — BP 130/62 | HR 73 | Ht 65.0 in | Wt 141.0 lb

## 2024-03-06 DIAGNOSIS — I10 Essential (primary) hypertension: Secondary | ICD-10-CM

## 2024-03-06 DIAGNOSIS — G459 Transient cerebral ischemic attack, unspecified: Secondary | ICD-10-CM

## 2024-03-06 NOTE — Patient Instructions (Signed)

## 2024-03-06 NOTE — Progress Notes (Signed)
 Cardiology Office Note:    Date:  03/06/2024   ID:  Leah Luna, DOB June 25, 1955, MRN [id number]  PCP:  Lionell Sonny FALCON, NP   [facility] Providers Cardiologist:  None     Referring MD: Geofm Nancie SQUIBB, MD   Chief Complaint  Patient presents with   Follow-up     61month follow up pt has been doing well with no complaints of chest pain, chest pressure or SOB, medciation reviewed verbally with patient    History of Present Illness:    Leah Luna is a 68 y.o. female with a hx of hypertension, hypothyroidism, anxiety, depression, TIA who presents for hospital follow-up.  Doing okay, denies chest pain/shortness of breath, endorses neck pain.  Follows up with pain specialist, gets periodic injections in her neck area.  Previously was on Plavix , Lipitor after TIA.  Cardiac monitor was placed, results still pending.  States not being on Plavix  or Lipitor, stopped by primary care physician.  Has a follow-up appointment in 4 days with PCP.  Denies palpitations, presyncope or syncope.  Prior notes/testing Echocardiogram 09/2023 showed normal EF 55 to 60%.   Past Medical History:  Diagnosis Date   Hypertension     No past surgical history on file.  Current Medications: Current Meds  Medication Sig   acetaminophen  (TYLENOL ) 650 MG CR tablet Take 650 mg by mouth every 8 (eight) hours as needed for pain.   amLODipine (NORVASC) 5 MG tablet Take 5 mg by mouth daily.   busPIRone  (BUSPAR ) 7.5 MG tablet Take 7.5 mg by mouth 2 (two) times daily.   CALCIUM  ANTACID 500 MG chewable tablet Chew by mouth.   diclofenac  Sodium (VOLTAREN ) 1 % GEL Apply topically 4 (four) times daily.   ergocalciferol (VITAMIN D2) 1.25 MG (50000 UT) capsule Take 50,000 Units by mouth once a week.   gabapentin  (NEURONTIN ) 100 MG capsule Take 100 mg by mouth at bedtime.   hydrochlorothiazide (HYDRODIURIL) 12.5 MG tablet Take 12.5 mg by mouth daily.   ibuprofen  (ADVIL ) 800 MG  tablet Take 1 tablet (800 mg total) by mouth every 8 (eight) hours as needed.   lidocaine  (XYLOCAINE ) 2 % solution Use as directed 15 mLs in the mouth or throat every 4 (four) hours as needed for mouth pain.   MAG-G 500 (27 Mg) MG TABS tablet Take 500 mg by mouth daily.   omeprazole (PRILOSEC) 40 MG capsule Take 40 mg by mouth daily.   PATADAY  0.1 % ophthalmic solution Place 1 drop into both eyes 2 (two) times daily.   SENNA PLUS 8.6-50 MG tablet Take 2 tablets by mouth at bedtime.   sertraline  (ZOLOFT ) 100 MG tablet Take 100 mg by mouth daily.     Allergies:   Amoxicillin, Calcium  carb-cholecalciferol, Egg protein-containing drug products, Omeprazole, and Porcine (pork) protein-containing drug products   Social History   Socioeconomic History   Marital status: Married    Spouse name: Not on file   Number of children: Not on file   Years of education: Not on file   Highest education level: Not on file  Occupational History   Not on file  Tobacco Use   Smoking status: Never    Passive exposure: Never   Smokeless tobacco: Never  Vaping Use   Vaping status: Never Used  Substance and Sexual Activity   Alcohol use: No    Alcohol/week: 0.0 standard drinks of alcohol   Drug use: No   Sexual activity: Yes  Other  Topics Concern   Not on file  Social History Narrative   Not on file   Social Drivers of Health   Financial Resource Strain: Medium Risk (01/11/2024)   Received from [facility]   Overall Financial Resource Strain (CARDIA)    Difficulty of Paying Living Expenses: Somewhat hard  Food Insecurity: No Food Insecurity (01/11/2024)   Received from [facility]   Hunger Vital Sign    Within the past 12 months, you worried that your food would run out before you got the money to buy more.: Never true    Within the past 12 months, the food you bought just didn't last and you didn't have money to get more.: Never true  Recent Concern: Food  Insecurity - Food Insecurity Present (10/24/2023)   Received from [facility]   Hunger Vital Sign    Within the past 12 months, you worried that your food would run out before you got the money to buy more.: Often true    Within the past 12 months, the food you bought just didn't last and you didn't have money to get more.: Sometimes true  Transportation Needs: No Transportation Needs (01/11/2024)   Received from [facility] - Transportation    In the past 12 months, has lack of transportation kept you from medical appointments or from getting medications?: No    Lack of Transportation (Non-Medical): No  Physical Activity: Not on file  Stress: Not on file  Social Connections: Unknown (09/18/2023)   Social Connection and Isolation Panel    Frequency of Communication with Friends and Family: Patient declined    Frequency of Social Gatherings with Friends and Family: Patient declined    Attends Religious Services: Patient declined    Database Administrator or Organizations: Patient declined    Attends Banker Meetings: Patient declined    Marital Status: Married     Family History: The patient's family history is negative for Breast cancer.  ROS:   Please see the history of present illness.     All other systems reviewed and are negative.  EKGs/Labs/Other Studies Reviewed:    The following studies were reviewed today:       Recent Labs: 09/18/2023: TSH 1.310 12/15/2023: ALT 12; BUN 13; Creatinine, Ser 0.71; Hemoglobin 13.4; Platelets 305; Potassium 3.4; Sodium 140  Recent Lipid Panel    Component Value Date/Time   CHOL 198 09/19/2023 0518   TRIG 124 09/19/2023 0518   HDL 43 09/19/2023 0518   CHOLHDL 4.6 09/19/2023 0518   VLDL 25 09/19/2023 0518   LDLCALC 130 (H) 09/19/2023 0518     Risk Assessment/Calculations:             Physical Exam:    VS:  BP 130/62 (BP Location: Left Arm, Patient Position: Sitting,  Cuff Size: Normal)   Pulse 73   Ht 5' 5 (1.651 m)   Wt 141 lb (64 kg)   LMP 03/03/2004   SpO2 99%   BMI 23.46 kg/m     Wt Readings from Last 3 Encounters:  03/06/24 141 lb (64 kg)  12/15/23 137 lb (62.1 kg)  12/06/23 138 lb 12.8 oz (63 kg)     GEN:  Well nourished, well developed in no acute distress HEENT: Normal NECK: No JVD; No carotid bruits CARDIAC: RRR, no murmurs, rubs, gallops RESPIRATORY:  Clear to auscultation without rales, wheezing or rhonchi  ABDOMEN: Soft, non-tender, non-distended  MUSCULOSKELETAL:  No edema; No deformity  SKIN: Warm and dry NEUROLOGIC:  Alert and oriented x 3 PSYCHIATRIC:  Normal affect   ASSESSMENT:    1. TIA (transient ischemic attack)   2. Primary hypertension    PLAN:    In order of problems listed above:  TIA, echo with normal EF.  Cardiac monitor while in hospital reviewed no A-fib or flutter.  Recommend patient continue Plavix , Lipitor 40 mg daily if no contraindications.  Antiplatelet, statin were apparently stopped by PCP, has follow-up appointment with PCP Monday.  Previous orthostatic vitals suggested positional vertigo. Hypertension, BP controlled.  Continue Norvasc 5 mg daily, HCTZ 12.5 mg daily.  Follow-up as needed    Medication Adjustments/Labs and Tests Ordered: Current medicines are reviewed at length with the patient today.  Concerns regarding medicines are outlined above.  No orders of the defined types were placed in this encounter.  No orders of the defined types were placed in this encounter.   Patient Instructions  Medication Instructions:  Your physician recommends that you continue on your current medications as directed. Please refer to the Current Medication list given to you today.   *If you need a refill on your cardiac medications before your next appointment, please call your pharmacy*  Lab Work: No labs ordered today  If you have labs (blood work) drawn today and your tests are completely normal,  you will receive your results only by: MyChart Message (if you have MyChart) OR A paper copy in the mail If you have any lab test that is abnormal or we need to change your treatment, we will call you to review the results.  Testing/Procedures: No test ordered today   Follow-Up: At [facility], you and your health needs are our priority.  As part of our continuing mission to provide you with exceptional heart care, our providers are all part of one team.  This team includes your primary Cardiologist (physician) and Advanced Practice Providers or APPs (Physician Assistants and Nurse Practitioners) who all work together to provide you with the care you need, when you need it.  Your next appointment:   As needed             Signed, Redell Cave, MD  03/06/2024 11:16 AM    [facility]
